# Patient Record
Sex: Female | Born: 1980 | Race: White | Hispanic: No | Marital: Married | Smoking: Never smoker
Health system: Southern US, Community
[De-identification: ages and names within clinical notes are randomized; demographics above are authoritative.]

## PROBLEM LIST (undated history)

## (undated) ENCOUNTER — Inpatient Hospital Stay (HOSPITAL_COMMUNITY): Payer: Self-pay

## (undated) DIAGNOSIS — F32A Depression, unspecified: Secondary | ICD-10-CM

## (undated) DIAGNOSIS — F329 Major depressive disorder, single episode, unspecified: Secondary | ICD-10-CM

## (undated) DIAGNOSIS — R1110 Vomiting, unspecified: Secondary | ICD-10-CM

## (undated) DIAGNOSIS — I10 Essential (primary) hypertension: Secondary | ICD-10-CM

## (undated) DIAGNOSIS — N1330 Unspecified hydronephrosis: Secondary | ICD-10-CM

## (undated) DIAGNOSIS — R87629 Unspecified abnormal cytological findings in specimens from vagina: Secondary | ICD-10-CM

## (undated) DIAGNOSIS — R51 Headache: Secondary | ICD-10-CM

## (undated) DIAGNOSIS — F419 Anxiety disorder, unspecified: Secondary | ICD-10-CM

## (undated) DIAGNOSIS — N611 Abscess of the breast and nipple: Secondary | ICD-10-CM

## (undated) DIAGNOSIS — N61 Mastitis without abscess: Secondary | ICD-10-CM

## (undated) DIAGNOSIS — T7840XA Allergy, unspecified, initial encounter: Secondary | ICD-10-CM

## (undated) HISTORY — DX: Depression, unspecified: F32.A

## (undated) HISTORY — DX: Major depressive disorder, single episode, unspecified: F32.9

## (undated) HISTORY — DX: Anxiety disorder, unspecified: F41.9

## (undated) HISTORY — DX: Abscess of the breast and nipple: N61.1

## (undated) HISTORY — PX: INCISE AND DRAIN ABCESS: PRO64

## (undated) HISTORY — DX: Unspecified abnormal cytological findings in specimens from vagina: R87.629

## (undated) HISTORY — DX: Allergy, unspecified, initial encounter: T78.40XA

## (undated) HISTORY — PX: MOUTH SURGERY: SHX715

## (undated) HISTORY — PX: INCISION AND DRAINAGE BREAST ABSCESS: SUR672

## (undated) HISTORY — DX: Unspecified hydronephrosis: N13.30

## (undated) HISTORY — DX: Mastitis without abscess: N61.0

---

## 2003-09-07 ENCOUNTER — Emergency Department (HOSPITAL_COMMUNITY): Admission: EM | Admit: 2003-09-07 | Discharge: 2003-09-07 | Payer: Self-pay | Admitting: Emergency Medicine

## 2004-09-22 ENCOUNTER — Emergency Department (HOSPITAL_COMMUNITY): Admission: EM | Admit: 2004-09-22 | Discharge: 2004-09-22 | Payer: Self-pay | Admitting: Emergency Medicine

## 2004-11-28 ENCOUNTER — Other Ambulatory Visit: Admission: RE | Admit: 2004-11-28 | Discharge: 2004-11-28 | Payer: Self-pay | Admitting: Obstetrics and Gynecology

## 2005-07-15 ENCOUNTER — Emergency Department (HOSPITAL_COMMUNITY): Admission: EM | Admit: 2005-07-15 | Discharge: 2005-07-16 | Payer: Self-pay | Admitting: Emergency Medicine

## 2009-12-21 ENCOUNTER — Ambulatory Visit: Payer: Self-pay | Admitting: Family Medicine

## 2010-01-20 ENCOUNTER — Emergency Department (HOSPITAL_COMMUNITY): Admission: EM | Admit: 2010-01-20 | Discharge: 2010-01-20 | Payer: Self-pay | Admitting: Emergency Medicine

## 2010-07-24 ENCOUNTER — Emergency Department (HOSPITAL_BASED_OUTPATIENT_CLINIC_OR_DEPARTMENT_OTHER): Admission: EM | Admit: 2010-07-24 | Discharge: 2010-07-24 | Payer: Self-pay | Admitting: Emergency Medicine

## 2010-10-16 NOTE — Letter (Signed)
Summary: Out of Work  [facility]  [address]   [place], [place] [address]   Phone: [phone]  Fax: [phone]    December 21, 2009   Employee:  AVAIAH STEMPEL    To Whom It May Concern:   For Medical reasons, please excuse the above named employee from work for the following dates:  Start:   21 Dec 2009  End:   23 Dec 2009 IF SYMPTOMS RESOLVING AND NO FEVER  If you need additional information, please feel free to contact our office.         Sincerely,    Marvis Moeller DO

## 2010-10-16 NOTE — Assessment & Plan Note (Signed)
Summary: ABD PAIN/VOMITING,DIAHREAH/DIZZINESS   Vital Signs:  Patient Profile:   30 Years Old Female CC:      chills, N/V/D, abd pain, X last night Height:     61 inches Weight:      108 pounds O2 Sat:      100 % O2 treatment:    Room Air Temp:     96.6 degrees F oral Pulse rate:   104 / minute Pulse rhythm:   regular Resp:     18 per minute BP sitting:   125 / 82  (right arm) Cuff size:   regular  Pt. in pain?   yes    Location:   abdomen    Intensity:   8    Type:       cramping  Vitals Entered By: Lajean Saver RN (December 21, 2009 8:56 AM)                   Updated Prior Medication List: MULTIVITAMINS  TABS (MULTIPLE VITAMIN) once daily MAGNESIUM GLUCONATE 500 MG TABS (MAGNESIUM GLUCONATE) once daily PEPTO-BISMOL MAX STRENGTH 525 MG/15ML SUSP (BISMUTH SUBSALICYLATE) as needed since last night  Current Allergies: ! PHENERGAN (PROMETHAZINE HCL)History of Present Illness Chief Complaint: chills, N/V/D, abd pain, X last night History of Present Illness: PATIENT STATES SHE ATE AT APPROX 9 PM. SUSHIE. VOMITING , CRAMPS AND DIARRHEA STARTED AT ABOUT 11 PM. HAS CONTINUED. NO FEVER. NO BLOOD IN STOOL OR EMISIS. HAS TAKEN PEPTO TABS. DENIES DIZZINESS.   REVIEW OF SYSTEMS Constitutional Symptoms       Complains of chills.     Denies fever, night sweats, weight loss, weight gain, and fatigue.  Eyes       Denies change in vision, eye pain, eye discharge, glasses, contact lenses, and eye surgery. Ear/Nose/Throat/Mouth       Denies hearing loss/aids, change in hearing, ear pain, ear discharge, dizziness, frequent runny nose, frequent nose bleeds, sinus problems, sore throat, hoarseness, and tooth pain or bleeding.  Respiratory       Denies dry cough, productive cough, wheezing, shortness of breath, asthma, bronchitis, and emphysema/COPD.  Cardiovascular       Denies murmurs, chest pain, and tires easily with exhertion.    Gastrointestinal       Complains of stomach pain,  nausea/vomiting, and diarrhea.      Denies constipation, blood in bowel movements, and indigestion. Genitourniary       Denies painful urination, kidney stones, and loss of urinary control. Neurological       Complains of weakness.      Denies headaches, loss of or changes in sensation, numbness, tngling, tremors, paralysis, seizures, and fainting/blackouts. Musculoskeletal       Denies muscle pain, joint pain, joint stiffness, decreased range of motion, redness, swelling, muscle weakness, and gout.  Skin       Denies bruising, unusual mles/lumps or sores, and hair/skin or nail changes.  Psych       Denies mood changes, temper/anger issues, anxiety/stress, speech problems, depression, and sleep problems. Other Comments: symptoms X last night   Past History:  Past Medical History: Unremarkable  Past Surgical History: Denies surgical history  Family History: Mother- Breast CA  Social History: Occupation: Herbalist Married Never Smoked Alcohol use-no Drug use-no Regular exercise-no Smoking Status:  never Drug Use:  no Does Patient Exercise:  no Physical Exam General appearance: well developed, well nourished, no acute distress Eyes: conjunctivae and lids normal Oral/Pharynx: tongue normal, posterior pharynx  without erythema or exudate Neck: neck supple,  trachea midline, no masses Chest/Lungs: no rales, wheezes, or rhonchi bilateral, breath sounds equal without effort Heart: regular rate and  rhythm, no murmur Abdomen: GENERALIZED TENDERNESS. BS HYPERACTIVE. NO MASS. NO GUARDING Skin: no obvious rashes or lesions ORTHO VS NEG Assessment New Problems: GASTROENTERITIS (ICD-558.9)   Plan New Medications/Changes: BENTYL 10 MG CAPS (DICYCLOMINE HCL) 1 by mouth Q 6 HRS as needed CRAMPS  ##20 x 0, 12/21/2009, Torian Thoennes DO ZOFRAN ODT 4 MG TBDP (ONDANSETRON) 1 by mouth Q 6-8 HRS as needed N/V  ##10 x 0, 12/21/2009, Marvis Moeller DO  New Orders: New Patient  Level III [99203] Admin of Therapeutic Inj  intramuscular or subcutaneous [96372]   Prescriptions: BENTYL 10 MG CAPS (DICYCLOMINE HCL) 1 by mouth Q 6 HRS as needed CRAMPS  ##20 x 0   Entered and Authorized by:   Marvis Moeller DO   Signed by:   Marvis Moeller DO on 12/21/2009   Method used:   Print then Give to Patient   RxID:   [id number] ZOFRAN ODT 4 MG TBDP (ONDANSETRON) 1 by mouth Q 6-8 HRS as needed N/V  ##10 x 0   Entered and Authorized by:   Marvis Moeller DO   Signed by:   Marvis Moeller DO on 12/21/2009   Method used:   Print then Give to Patient   RxID:   [id number]   Patient Instructions: 1)  SIPS OF CLEAR LIQUIDS FREQUENTLY. AVOID CAFFEINE AND MILK PRODUCTS. AD VANCE DIET WITH JELLO, CHICKEN NOODLE SOUP , CRACKERS AS DISCUSSED. ROPORT TO THE LOCAL ER IF SYMPTOMS PERSIST DESPITE TREATMENT, IF FEVER DEVELOPES , OR PAIN INCREASES.    Medication Administration  Injection # 1:    Medication: ondansetron    Diagnosis: GASTROENTERITIS (ICD-558.9)    Route: IM    Site: RUOQ gluteus    Exp Date: 05/17/2011    Lot #: [id number]    Mfr: Hospira    Patient tolerated injection without complications    Given by: Lajean Saver RN (December 21, 2009 10:41 AM)  Orders Added: 1)  New Patient Level III [99203] 2)  Admin of Therapeutic Inj  intramuscular or subcutaneous [[id number]]

## 2010-11-27 LAB — URINALYSIS, ROUTINE W REFLEX MICROSCOPIC
Ketones, ur: >80 mg/dL — AB
Nitrite: NEGATIVE
Specific Gravity, Urine: 1.016 (ref 1.005–1.030)
Urobilinogen, UA: 0.2 mg/dL (ref 0.0–1.0)
pH: 7 (ref 5.0–8.0)

## 2010-11-27 LAB — BASIC METABOLIC PANEL
BUN: 7 mg/dL (ref 6–23)
CO2: 20 mEq/L (ref 19–32)
Calcium: 10.2 mg/dL (ref 8.4–10.5)
GFR calc non Af Amer: >60 mL/min (ref >60)
Potassium: 3.7 mEq/L (ref 3.5–5.1)

## 2010-11-27 LAB — PREGNANCY, URINE: Preg Test, Ur: POSITIVE

## 2010-11-27 LAB — URINE MICROSCOPIC-ADD ON

## 2010-12-04 LAB — BASIC METABOLIC PANEL
Calcium: 9.6 mg/dL (ref 8.4–10.5)
Chloride: 105 mEq/L (ref 96–112)
GFR calc Af Amer: >60 mL/min (ref >60)
GFR calc non Af Amer: >60 mL/min (ref >60)
Potassium: 4 mEq/L (ref 3.5–5.1)
Sodium: 138 mEq/L (ref 135–145)

## 2010-12-04 LAB — URINALYSIS, ROUTINE W REFLEX MICROSCOPIC
Glucose, UA: NEGATIVE mg/dL
Ketones, ur: 40 mg/dL — AB
Nitrite: NEGATIVE
Protein, ur: NEGATIVE mg/dL
Specific Gravity, Urine: 1.019 (ref 1.005–1.030)
Urobilinogen, UA: 0.2 mg/dL (ref 0.0–1.0)
pH: 6.5 (ref 5.0–8.0)

## 2010-12-04 LAB — URINE MICROSCOPIC-ADD ON

## 2010-12-04 LAB — DIFFERENTIAL
Basophils Relative: 1 % (ref 0–1)
Eosinophils Absolute: 0.1 10*3/uL (ref 0.0–0.7)
Neutrophils Relative %: 63 % (ref 43–77)

## 2010-12-04 LAB — CBC
Platelets: 296 10*3/uL (ref 150–400)
RBC: 4.02 MIL/uL (ref 3.87–5.11)
WBC: 9.2 10*3/uL (ref 4.0–10.5)

## 2011-03-15 ENCOUNTER — Inpatient Hospital Stay (HOSPITAL_COMMUNITY)
Admission: AD | Admit: 2011-03-15 | Discharge: 2011-03-17 | DRG: 775 | Disposition: A | Discharge status: Home / Self Care | Payer: Medicaid Other | Source: Ambulatory Visit | Attending: Obstetrics and Gynecology | Admitting: Obstetrics and Gynecology

## 2011-03-15 DIAGNOSIS — O9903 Anemia complicating the puerperium: Secondary | ICD-10-CM | POA: Diagnosis not present

## 2011-03-15 DIAGNOSIS — O135 Gestational [pregnancy-induced] hypertension without significant proteinuria, complicating the puerperium: Principal | ICD-10-CM | POA: Diagnosis not present

## 2011-03-15 DIAGNOSIS — D649 Anemia, unspecified: Secondary | ICD-10-CM | POA: Diagnosis not present

## 2011-03-15 LAB — CBC
Hemoglobin: 12.6 g/dL (ref 12.0–15.0)
Platelets: 236 10*3/uL (ref 150–400)
RDW: 13 % (ref 11.5–15.5)

## 2011-03-15 LAB — COMPREHENSIVE METABOLIC PANEL
ALT: 8 U/L (ref 0–35)
AST: 15 U/L (ref 0–37)
BUN: 8 mg/dL (ref 6–23)
CO2: 22 mEq/L (ref 19–32)
Calcium: 10 mg/dL (ref 8.4–10.5)
Chloride: 95 mEq/L — ABNORMAL LOW (ref 96–112)
Creatinine, Ser: 0.59 mg/dL (ref 0.50–1.10)
Potassium: 3.6 mEq/L (ref 3.5–5.1)
Total Bilirubin: 0.4 mg/dL (ref 0.3–1.2)
Total Protein: 6.4 g/dL (ref 6.0–8.3)

## 2011-03-15 LAB — URINALYSIS, ROUTINE W REFLEX MICROSCOPIC
Ketones, ur: 40 mg/dL — AB
Nitrite: NEGATIVE
pH: 8 (ref 5.0–8.0)

## 2011-03-15 LAB — URIC ACID: Uric Acid, Serum: 4.8 mg/dL (ref 2.4–7.0)

## 2011-03-15 LAB — RPR: RPR Ser Ql: NONREACTIVE

## 2011-03-15 LAB — LACTATE DEHYDROGENASE: LDH: 147 U/L (ref 94–250)

## 2011-03-16 LAB — COMPREHENSIVE METABOLIC PANEL
ALT: 13 U/L (ref 0–35)
Albumin: 2.5 g/dL — ABNORMAL LOW (ref 3.5–5.2)
Alkaline Phosphatase: 134 U/L — ABNORMAL HIGH (ref 39–117)
BUN: 8 mg/dL (ref 6–23)
Chloride: 95 mEq/L — ABNORMAL LOW (ref 96–112)
Creatinine, Ser: 0.64 mg/dL (ref 0.50–1.10)
GFR calc Af Amer: >60 mL/min (ref >60)
GFR calc non Af Amer: >60 mL/min (ref >60)
Glucose, Bld: 92 mg/dL (ref 70–99)
Total Bilirubin: 0.4 mg/dL (ref 0.3–1.2)
Total Protein: 5.9 g/dL — ABNORMAL LOW (ref 6.0–8.3)

## 2011-03-16 LAB — CBC
MCH: 31.7 pg (ref 26.0–34.0)
Platelets: 186 10*3/uL (ref 150–400)
RBC: 3.19 MIL/uL — ABNORMAL LOW (ref 3.87–5.11)

## 2011-03-16 LAB — LACTATE DEHYDROGENASE: LDH: 237 U/L (ref 94–250)

## 2011-03-16 LAB — URIC ACID: Uric Acid, Serum: 5.1 mg/dL (ref 2.4–7.0)

## 2011-03-17 LAB — URINALYSIS, MICROSCOPIC ONLY
Bilirubin Urine: NEGATIVE
Hgb urine dipstick: NEGATIVE
Protein, ur: NEGATIVE mg/dL
pH: 6 (ref 5.0–8.0)

## 2011-04-01 ENCOUNTER — Inpatient Hospital Stay (HOSPITAL_COMMUNITY)
Admission: AD | Admit: 2011-04-01 | Discharge: 2011-04-11 | DRG: 776 | Disposition: A | Discharge status: Home / Self Care | Payer: Medicaid Other | Source: Ambulatory Visit | Attending: Obstetrics and Gynecology | Admitting: Obstetrics and Gynecology

## 2011-04-01 DIAGNOSIS — O9122 Nonpurulent mastitis associated with the puerperium: Secondary | ICD-10-CM | POA: Diagnosis present

## 2011-04-01 DIAGNOSIS — N61 Mastitis without abscess: Secondary | ICD-10-CM

## 2011-04-01 DIAGNOSIS — N611 Abscess of the breast and nipple: Secondary | ICD-10-CM | POA: Clinically undetermined

## 2011-04-01 DIAGNOSIS — O9112 Abscess of breast associated with the puerperium: Principal | ICD-10-CM | POA: Diagnosis present

## 2011-04-01 DIAGNOSIS — I10 Essential (primary) hypertension: Secondary | ICD-10-CM | POA: Diagnosis present

## 2011-04-01 HISTORY — DX: Essential (primary) hypertension: I10

## 2011-04-01 MED ORDER — IRON 325 (65 FE) MG PO TABS: ORAL_TABLET | ORAL | Status: DC

## 2011-04-01 MED ORDER — IBUPROFEN 800 MG PO TABS
800.0000 mg | ORAL_TABLET | Freq: Three times a day (TID) | ORAL | Status: DC | PRN
  Administered 2011-04-03 – 2011-04-11 (×10): 800 mg via ORAL
  Filled 2011-04-01 (×11): qty 1

## 2011-04-01 MED ORDER — FERROUS SULFATE 325 (65 FE) MG PO TABS
325.0000 mg | ORAL_TABLET | Freq: Every day | ORAL | Status: DC
  Administered 2011-04-03 – 2011-04-11 (×7): 325 mg via ORAL
  Filled 2011-04-01 (×9): qty 1

## 2011-04-01 MED ORDER — CEFAZOLIN SODIUM-DEXTROSE 2-3 GM-% IV SOLR
2.0000 g | Freq: Four times a day (QID) | INTRAVENOUS | Status: DC
  Administered 2011-04-01 – 2011-04-05 (×16): 2 g via INTRAVENOUS
  Filled 2011-04-01 (×18): qty 50

## 2011-04-01 MED ORDER — ZOLPIDEM TARTRATE 5 MG PO TABS
5.0000 mg | ORAL_TABLET | Freq: Every evening | ORAL | Status: DC | PRN
  Administered 2011-04-01 – 2011-04-11 (×9): 5 mg via ORAL
  Filled 2011-04-01 (×9): qty 1

## 2011-04-01 MED ORDER — LACTATED RINGERS IV SOLN
INTRAVENOUS | Status: DC
  Administered 2011-04-01 – 2011-04-10 (×20): via INTRAVENOUS

## 2011-04-01 MED ORDER — OXYCODONE-ACETAMINOPHEN 5-325 MG PO TABS
1.0000 | ORAL_TABLET | ORAL | Status: DC | PRN
  Administered 2011-04-01: 2 via ORAL
  Administered 2011-04-02: 1 via ORAL
  Administered 2011-04-02 – 2011-04-05 (×11): 2 via ORAL
  Administered 2011-04-05 – 2011-04-06 (×3): 1 via ORAL
  Administered 2011-04-06: 2 via ORAL
  Administered 2011-04-06: 1 via ORAL
  Administered 2011-04-06: 2 via ORAL
  Administered 2011-04-06: 1 via ORAL
  Administered 2011-04-06 – 2011-04-11 (×16): 2 via ORAL
  Filled 2011-04-01 (×2): qty 2
  Filled 2011-04-01: qty 1
  Filled 2011-04-01: qty 2
  Filled 2011-04-01 (×2): qty 1
  Filled 2011-04-01 (×5): qty 2
  Filled 2011-04-01: qty 1
  Filled 2011-04-01 (×4): qty 2
  Filled 2011-04-01: qty 1
  Filled 2011-04-01 (×3): qty 2
  Filled 2011-04-01: qty 1
  Filled 2011-04-01: qty 2
  Filled 2011-04-01: qty 1
  Filled 2011-04-01 (×5): qty 2
  Filled 2011-04-01: qty 1
  Filled 2011-04-01 (×8): qty 2
  Filled 2011-04-01: qty 1

## 2011-04-01 MED ORDER — ONDANSETRON HCL 4 MG/2ML IJ SOLN
4.0000 mg | Freq: Three times a day (TID) | INTRAMUSCULAR | Status: DC | PRN
  Administered 2011-04-02 (×2): 4 mg via INTRAVENOUS
  Filled 2011-04-01 (×2): qty 2

## 2011-04-01 MED ORDER — SERTRALINE HCL 50 MG PO TABS
50.0000 mg | ORAL_TABLET | Freq: Every day | ORAL | Status: DC
  Administered 2011-04-01 – 2011-04-11 (×11): 50 mg via ORAL
  Filled 2011-04-01 (×11): qty 1

## 2011-04-01 NOTE — H&P (Signed)
  S: 30 y/o G1P1 s/p SVD Diagnosed with Mastitis on Friday and started on Dicloxicillin 500 mg po States Left Breast significantly worse today, as well as Uterine cramping and pain. States passed quarter size clot this am.   Review of Systems  Constitutional: Positive for chills and malaise/fatigue. Negative for fever and weight loss.  HENT: Negative.   Eyes: Negative.   Respiratory: Negative.   Cardiovascular: Negative.   Gastrointestinal: Positive for nausea and abdominal pain. Negative for heartburn, vomiting, diarrhea, constipation, blood in stool and melena.  Genitourinary: Negative.   Musculoskeletal: Negative.   Skin: Negative.   Neurological: Positive for weakness.  Psychiatric/Behavioral: Negative.     O; VS 97.3, 101, 20, 144/92 Physical Exam  Constitutional: She is oriented to person, place, and time. She appears well-developed and well-nourished.  HENT:  Head: Normocephalic.  Neck: Normal range of motion.  Cardiovascular: Normal rate, regular rhythm and normal heart sounds.   Pulmonary/Chest: Breath sounds normal. No respiratory distress. Right breast exhibits nipple discharge (lactating), skin change and tenderness. Left breast exhibits nipple discharge. There is breast swelling.    Abdominal: Soft. Bowel sounds are normal. She exhibits no distension and no mass. There is tenderness (Slightly tender to fundal massage). There is no rebound and no guarding.  Musculoskeletal: Normal range of motion. She exhibits no edema and no tenderness.  Neurological: She is alert and oriented to person, place, and time.  Skin: Skin is warm and dry. There is pallor.  Psychiatric: She has a normal mood and affect. Her behavior is normal. Judgment and thought content normal.   A: Mastitis P: Admit for Abx Therapy and Breast support 1. Ancef 2 gms IVPB q 6 hrs 2. Percocet for pain prn 3. Continue to utilized Breast pump 4. Lactation consult 5. Dr. Estanislado Pandy aware and to follow

## 2011-04-02 MED ORDER — NIFEDIPINE ER 30 MG PO TB24
30.0000 mg | ORAL_TABLET | Freq: Every day | ORAL | Status: DC
  Administered 2011-04-02 – 2011-04-03 (×2): 30 mg via ORAL
  Filled 2011-04-02 (×3): qty 1

## 2011-04-02 MED ORDER — DEXTROSE 5 % IV SOLN
8.0000 mg | Freq: Three times a day (TID) | INTRAVENOUS | Status: DC | PRN
  Administered 2011-04-02 – 2011-04-04 (×4): 8 mg via INTRAVENOUS
  Filled 2011-04-02 (×3): qty 4

## 2011-04-02 MED ORDER — ONDANSETRON HCL 4 MG/2ML IJ SOLN: 4.0000 mg | INTRAMUSCULAR | Status: AC

## 2011-04-02 NOTE — Progress Notes (Signed)
UR Chart review completed.  

## 2011-04-02 NOTE — Progress Notes (Signed)
Subjective: Patient reports nausea this am, but tolerated po intake without vomiting.  Reports right breast less painful, area of nipple trauma less sensitive.  No chills, mild headache.  Denies pp depression.  Up to BR without syncope or dizziness.  Mild pain in left fundal area, mild cramping.  Objective: I have reviewed patient's vital signs and medications. BP 145/88  Pulse 102  Temp(Src) 98 F (36.7 C) (Oral)  Resp 18  Ht 5\' 1"  (1.549 m)  SpO2 98% General: alert Resp: clear to auscultation bilaterally Cardio: regular rate and rhythm, S1, S2 normal, no murmur, click, rub or gallop GI: soft, non-tender; bowel sounds normal; no masses,  no organomegaly Extremities: extremities normal, atraumatic, no cyanosis or edema Vaginal Bleeding: minimal Fundus:  2 below, no signifcant tenderness to palpation, no clots expressed. Breasts:  Left WNL                 Right still slightly red, area of nipple trauma healing well.   Assessment/Plan: Continue current care. Await breastmilk culture results   LOS: 1 day    Noelle Sease L 04/02/2011, 10:40 AM

## 2011-04-03 ENCOUNTER — Encounter (HOSPITAL_COMMUNITY): Payer: Self-pay

## 2011-04-03 MED ORDER — SULFAMETHOXAZOLE-TMP DS 800-160 MG PO TABS
1.0000 | ORAL_TABLET | Freq: Two times a day (BID) | ORAL | Status: DC
  Administered 2011-04-03 – 2011-04-05 (×5): 1 via ORAL
  Filled 2011-04-03 (×7): qty 1

## 2011-04-03 NOTE — Progress Notes (Signed)
Subjective: Patient reports nausea, tolerating PO and no problems voiding.  Taking both Percocet for pain with good results and Zofran for Nausea States  R Breast still significantly sore, continues to pump Infant has not been to hospital since patient's arrival, but states spouse "may bring her today" Denies increase in PP depression symptoms  Denies HA, Scotoma or Epigastric pain  Objective: I have reviewed patient's vital signs, intake and output, medications and still pending Breast Culture.  T: 98, P: 102, R: 18, BP: 143/98  General: alert, cooperative and drowsy Resp: clear to auscultation bilaterally Cardio: regular rate and rhythm, S1, S2 normal, no murmur, click, rub or gallop GI: soft, non-tender; bowel sounds normal; no masses,  no organomegaly Extremities: extremities normal, atraumatic, no cyanosis or edema, Homans sign is negative, no sign of DVT and no edema, redness or tenderness in the calves or thighs Vaginal Bleeding: minimal and without clots or odor Breast: still with errythema and tenderness, significantly less nipple trauma Fundus well below umbilicus and firm with no pain upon palpation Mild LUQ pain w/palp  Assessment/Plan: Acute PP Mastitis  Continue Antibiotic therapy pending Breast milk culture Continue Breast supportive care (awaiting Lactation consultion) MD to follow   LOS: 2 days    Anabel Halon 04/03/2011, 8:49 AM

## 2011-04-03 NOTE — Progress Notes (Signed)
Agree with CNM note.  Although erythema has decreased some since admission.  Patient reports pain is still excrutiating once pain medicine starts wearing off.  Lab reports staph aureus growing in culture but sensitivities will not be back until tomorrow.  With the tenderness remaining and the report of staph, will start bactrim ds.  Pt informed safety conditional with breast feeding.  Continue BP med.

## 2011-04-04 LAB — COMPREHENSIVE METABOLIC PANEL
Alkaline Phosphatase: 137 U/L — ABNORMAL HIGH (ref 39–117)
BUN: 3 mg/dL — ABNORMAL LOW (ref 6–23)
Chloride: 97 mEq/L (ref 96–112)
Creatinine, Ser: <0.47 mg/dL — ABNORMAL LOW (ref 0.50–1.10)
Glucose, Bld: 142 mg/dL — ABNORMAL HIGH (ref 70–99)
Potassium: 2.9 mEq/L — ABNORMAL LOW (ref 3.5–5.1)
Total Bilirubin: 0.1 mg/dL — ABNORMAL LOW (ref 0.3–1.2)
Total Protein: 7.4 g/dL (ref 6.0–8.3)

## 2011-04-04 LAB — DIFFERENTIAL
Eosinophils Absolute: 0.1 10*3/uL (ref 0.0–0.7)
Lymphs Abs: 2.7 10*3/uL (ref 0.7–4.0)
Monocytes Absolute: 0.9 10*3/uL (ref 0.1–1.0)
Monocytes Relative: 4 % (ref 3–12)
Neutrophils Relative %: 84 % — ABNORMAL HIGH (ref 43–77)

## 2011-04-04 LAB — CBC
HCT: 30.5 % — ABNORMAL LOW (ref 36.0–46.0)
Hemoglobin: 10.2 g/dL — ABNORMAL LOW (ref 12.0–15.0)
MCH: 30.2 pg (ref 26.0–34.0)
RBC: 3.38 MIL/uL — ABNORMAL LOW (ref 3.87–5.11)

## 2011-04-04 MED ORDER — NIFEDIPINE ER 60 MG PO TB24
60.0000 mg | ORAL_TABLET | Freq: Every day | ORAL | Status: DC
  Administered 2011-04-04 – 2011-04-08 (×5): 60 mg via ORAL
  Filled 2011-04-04 (×6): qty 1

## 2011-04-04 NOTE — Progress Notes (Signed)
Subjective: Patient reports right breast is still painful, but slightly less than yesterday.  Nipple feels less painful and raw.  Denies HA, visual symptoms, or epigastric pain.  Reports occasional teary spells when pumping, but denies significant postpartum depression.  Pumping and discarding breastmilk at present, due to staph aureas breastmilk culture.  Denies chills, fever, or abdominal/uterine pain.  Objective: I have reviewed patient's vital signs and medications. BP was elevated this am, with Dr. Su Hilt notified.  Procardia dose increased to 60 mg XL this am. Now on Bactrim po.  BP 1 hour after Procardia 144/88.  On contact precautions for staph aureus breast milk culture.  Filed Vitals:   04/03/11 2256 04/04/11 0158 04/04/11 0610 04/04/11 0630  BP: 145/95  156/108 158/106  Pulse: 87  98   Temp: 99.3 F (37.4 C) 98.3 F (36.8 C) 99.1 F (37.3 C)   TempSrc: Oral Oral Oral   Resp: 18  19   Height:      Weight:      SpO2: 98%  96%      General: alert, in good spirits Resp: clear to auscultation bilaterally Cardio: regular rate and rhythm, S1, S2 normal, no murmur, click, rub or gallop GI: soft, non-tender; bowel sounds normal; no masses,  no organomegaly Extremities: extremities normal, atraumatic, no cyanosis or edema, DTR 2+ without clonus, trace edema. Vaginal Bleeding: minimal Uterus:  Non-tender, fundus firm, well-involuted. Breasts:  Left WNL                 Right:  Approximately 5-6 cm area of redness and induration in the lower half of the breast, under the nipple and aerola.                 Right nipple healing well from previous trauma. Breast milk culture showed staph aureus--awaiting sensitivities.  Assessment/Plan: Continue antibiotics. Continue Bactrim at present--await sensitivities. Continue to monitor BP s/p increasing Procardia dose to 60 mg XL qday. MDs will follow.  LOS: 3 days   Shamyah Stantz L 04/04/2011

## 2011-04-04 NOTE — Progress Notes (Signed)
Breast assessed by Lactation.  LLQ & RLQ of Right breast reddened, warm & tender to touch.  Quarter sized, hard knot noted on LLQ of right breast - very tender to touch.  Mom on contact precautions pending MRSA cultures & on IV Antibiotics (Bactrim - L3 classification for BF & Ancef - L1 classification for BF Bobbye Morton - Medications & Mother's Milk)).  Pumping with DEBP q3hrs, mom states obtaining 4 ounces total (2 ounces from each breast) with pumping.  Ice pack given and instructed to ice Right breast 15-20 minutes prior to pumping.  Instructed to gently massage affected areas while pumping to prevent continuing milk stasis.  Mom pumping and dumping at present until MRSA cultures completed and IV Bactrim completed or changed to an Abx compatible with breastfeeding.  Encouraged to continue pumping q3hrs, 8x/24hrs.  Mom verbalized understanding.  Plan of care communicated to RN.

## 2011-04-04 NOTE — Progress Notes (Signed)
Dr. Su Hilt called. New order to increase Procardia XL to 60mg  qd.

## 2011-04-04 NOTE — Progress Notes (Signed)
Gevena Barre notified for pts. BP 158/106. No new orders. Told to call Dr.Roberts. Will page Dr.Roberts now.

## 2011-04-05 ENCOUNTER — Inpatient Hospital Stay
Admit: 2011-04-05 | Discharge: 2011-04-05 | Disposition: A | Discharge status: Home / Self Care | Payer: Medicaid Other | Attending: General Surgery | Admitting: General Surgery

## 2011-04-05 ENCOUNTER — Inpatient Hospital Stay: Admit: 2011-04-05 | Discharge: 2011-04-05 | Disposition: A | Discharge status: Home / Self Care | Payer: Medicaid Other

## 2011-04-05 ENCOUNTER — Inpatient Hospital Stay: Admit: 2011-04-05 | Payer: Medicaid Other

## 2011-04-05 DIAGNOSIS — N61 Mastitis without abscess: Secondary | ICD-10-CM

## 2011-04-05 LAB — DIFFERENTIAL
Basophils Absolute: 0 10*3/uL (ref 0.0–0.1)
Lymphocytes Relative: 9 % — ABNORMAL LOW (ref 12–46)
Neutro Abs: 18.2 10*3/uL — ABNORMAL HIGH (ref 1.7–7.7)
Neutrophils Relative %: 87 % — ABNORMAL HIGH (ref 43–77)

## 2011-04-05 LAB — CBC
Platelets: 627 10*3/uL — ABNORMAL HIGH (ref 150–400)
RDW: 13.2 % (ref 11.5–15.5)
WBC: 21 10*3/uL — ABNORMAL HIGH (ref 4.0–10.5)

## 2011-04-05 MED ORDER — VANCOMYCIN HCL 1000 MG IV SOLR
1500.0000 mg | Freq: Two times a day (BID) | INTRAVENOUS | Status: DC
  Administered 2011-04-05 – 2011-04-10 (×10): 1500 mg via INTRAVENOUS
  Filled 2011-04-05 (×12): qty 1500

## 2011-04-05 MED ORDER — DIPHENHYDRAMINE HCL 50 MG/ML IJ SOLN
25.0000 mg | Freq: Four times a day (QID) | INTRAMUSCULAR | Status: DC | PRN
  Administered 2011-04-05 – 2011-04-10 (×9): 25 mg via INTRAVENOUS
  Filled 2011-04-05 (×9): qty 1

## 2011-04-05 NOTE — Progress Notes (Signed)
Brittany Beltran is a 30 y.o. Admitted with mastitis.  Reports that she is feeling worse today.  Her breast pain is progressing.   Subjective:   Objective: BP 128/86  Pulse 77  Temp(Src) 98.7 F (37.1 C) (Oral)  Resp 19  Ht 5\' 1"  (1.549 m)  Wt 76.204 kg (168 lb)  BMI 31.74 kg/m2  SpO2 97%    Breast:  Area is tenderness, and redness is getting larger.       Labs: Lab Results  Component Value Date   WBC 22.9* 04/04/2011   HGB 10.2* 04/04/2011   HCT 30.5* 04/04/2011   MCV 90.2 04/04/2011   PLT 578* 04/04/2011    Assessment / Plan: Progressing Mastitis  Will consult  Gen Surgery.  Continue Ancef and Septra for now. CBC.  Eliette Drumwright V 04/05/2011, 10:26 AM

## 2011-04-05 NOTE — Progress Notes (Signed)
UR Chart review completed.  

## 2011-04-05 NOTE — Consult Note (Signed)
ANTIBIOTIC CONSULT NOTE - INITIAL  Pharmacy Consult for vancomycin Indication: mastitis  Patient Measurements: Height: 5\' 1"  (154.9 cm) Weight: 168 lb (76.204 kg) IBW/kg (Calculated) : 47.8    Vital Signs: Temp: 97.4 F (36.3 C) (07/20 1400) Temp src: Oral (07/20 1400) BP: 125/78 mmHg (07/20 1400) Pulse Rate: 113  (07/20 1400)   Labs:  Basename 04/04/11 1202  WBC 22.9*  HGB 10.2*  PLT 578*  LABCREA --  CREATININE <0.47*  CRCLEARANCE --   Microbiology: No results found for this or any previous visit (from the past 720 hour(s)).  Medical History: Past Medical History  Diagnosis Date  . Hypertension     Medications:  Anti-infectives     Start     Dose/Rate Route Frequency Ordered Stop   04/03/11 1100   sulfamethoxazole-trimethoprim (BACTRIM DS) 800-160 MG per tablet 1 tablet  Status:  Discontinued        1 tablet Oral Every 12 hours 04/03/11 1043 04/05/11 1451   04/01/11 1800   ceFAZolin (ANCEF) IVPB 2 g/50 mL premix  Status:  Discontinued        2 g 100 mL/hr over 30 Minutes Intravenous 4 times per day 04/01/11 1724 04/05/11 1451        Pt on dicloxacillin as outpatient.   Assessment: 30 yo with normal creatinine clearance.  Has failed therapy on Bactrim and Ancef.  Elevated WBC and febrile.   Goal of Therapy:  Vancomycin trough level 10-15 mcg/ml  Plan:  Give vancomycin 1500mg  Q12H to start today.  Measure antibiotic drug levels at steady state  Wilson, Manaal [place] 04/05/2011,3:11 PM

## 2011-04-06 NOTE — Progress Notes (Signed)
Subjective: Patient reports tolerating PO.  Feels better. Redness and swelling are better after benadryl (patient developed "red-man" syndrome after Vancomycin).  Objective: I have reviewed patient's vital signs and intake and output.  General: alert and no distress Right breast is still tender and red but less so than yesterday.  Swelling is better.   Assessment/Plan: Slow improvement of right mastitis.  Better after Vanc and drainage.  LOS: 5 days    Brittany Beltran V 04/06/2011, 12:30 PM

## 2011-04-06 NOTE — Progress Notes (Signed)
  Subjective: Pain is better but not resolved after aspiration yesterday.  Patient is currently pumping her breasts.  Cultures are pending.    Objective: Vital signs in last 24 hours: Temp:  [97.4 F (36.3 C)-98.3 F (36.8 C)] 97.8 F (36.6 C) (07/21 0500) Pulse Rate:  [87-113] 87  (07/21 0500) Resp:  [18] 18  (07/21 0500) BP: (122-135)/(78-89) 123/89 mmHg (07/21 0500) SpO2:  [97 %-99 %] 98 % (07/21 0500) Last BM Date: 04/03/11  Intake/Output from previous day: 07/20 0701 - 07/21 0700 In: 2662.1 [P.O.:1160; I.V.:1502.1] Out: 1475 [Urine:1475] Intake/Output this shift:      Lab Results:  @LABLAST2 (wbc:2,hgb:2,hct:2,plt:2) BMET  Basename 04/04/11 1202  NA 136  K 2.9*  CL 97  CO2 27  GLUCOSE 142*  BUN 3*  CREATININE <0.47*  CALCIUM 9.3   PT/INR No results found for this basename: LABPROT:2,INR:2 in the last 72 hours ABG No results found for this basename: PHART:2,PCO2:2,PO2:2,HCO3:2 in the last 72 hours  Studies/Results: No results found.  Anti-infectives: Anti-infectives     Start     Dose/Rate Route Frequency Ordered Stop   04/03/11 1100   sulfamethoxazole-trimethoprim (BACTRIM DS) 800-160 MG per tablet 1 tablet  Status:  Discontinued        1 tablet Oral Every 12 hours 04/03/11 1043 04/05/11 1451   04/01/11 1800   ceFAZolin (ANCEF) IVPB 2 g/50 mL premix  Status:  Discontinued        2 g 100 mL/hr over 30 Minutes Intravenous 4 times per day 04/01/11 1724 04/05/11 1451          Assessment/Plan: s/p  Patient is at risk for lactiferous fistula should open drainage be required.  Await cultures and adjust antibiotics if necessary.  CCS will follow with you.   LOS: 5 days    Illya Gienger B 04/06/2011

## 2011-04-06 NOTE — Consult Note (Signed)
Brittany Beltran, Brittany Beltran             ACCOUNT NO.:  [id number]  MEDICAL RECORD NO.:  [id number]  LOCATION:  9306                          FACILITY:  [facility]  PHYSICIAN:  Sharlet Salina T. Hoxworth, M.D.DATE OF BIRTH:  Jan 21, 1981  DATE OF CONSULTATION:  04/05/2011 DATE OF DISCHARGE:                                CONSULTATION   CONSULTATION TIME:  12:20 p.m.  REQUESTING PHYSICIAN:  Janine Limbo, MD  CONSULTING SURGEON:  Lorne Skeens. Hoxworth, MD  REASON FOR CONSULTATION:  Right breast mastitis.  HISTORY OF PRESENT ILLNESS:  Brittany Beltran is a 30 year old white female with no other significant medical problems who is approximately 3 weeks postpartum.  Last Friday 1 week ago, her baby latched onto her right breast but jerked off.  In doing so this ripped her nipple.  She says that it appear that she has some purulent drainage from this.  She went to her OB's office where she was diagnosed with mastitis.  She was placed on some antibiotics, however, Monday, she called them back as this was worsening.  After being evaluated by her obstetrician, she was admitted for further management.  She has been placed on IV Ancef as well as p.o. Septra while in the hospital.  However, today she had some lab work which revealed a white count of 22,000.  Apparently, her erythema has significantly worsened in the last day, at this time, we have been asked to evaluate the patient.  She did have a culture of her breast milk done in the doctor's office which revealed staph.  The patient does admit to some fevers as well as chills.  REVIEW OF SYSTEMS:  Please see HPI.  Otherwise all other systems have been reviewed and are currently negative.  FAMILY HISTORY:  Noncontributory.  PAST MEDICAL HISTORY:  Hypertension, postpartum depression.  PAST SURGICAL HISTORY:  None.  SOCIAL HISTORY:  The patient is married with one child.  She denies any alcohol, tobacco, or illicit drug abuse.  ALLERGIES:   PHENERGAN.  MEDICATIONS: 1. Zoloft 50 mg daily. 2. Zantac 150 mg b.i.d. 3. Probiotic. 4. Percocet 5/325 one tablet q.4 hours p.r.n. pain. 5. Zofran 8 mg q.8 hours as needed. 6. Multivitamin. 7. Iron. 8. Advil as needed. 9. Diflucan 150 mg once. 10.Dynapen 250 mg 2 tablets q.4 hours.  PHYSICAL EXAMINATION:  GENERAL:  Brittany Beltran is a pleasant well- developed, well-nourished 30 year old white female in no acute distress. VITAL SIGNS:  Temperature 100, pulse 114, blood pressure 120/71, respirations 18. HEENT:  Head is normocephalic, atraumatic.  Sclerae are noninjected. Pupils are equal, round, and reactive to light.  Ears and nose without any obvious masses or lesions.  No rhinorrhea.  Mouth is pink.  Throat shows no exudate. HEART:  Regular rhythm, however, she is tachycardic.  Otherwise normal S1 and S2.  No murmurs, gallops, or rubs are noted.  She does have palpable carotid, radial, and pedal pulses bilaterally. LUNGS:  Clear to auscultation bilaterally with no wheezes, rhonchi, or rales are noted.  Respiratory is nonlabored. CHEST/BREASTS:  The patient's right breast in the inferior portion on the right and left side is diffuse erythema.  There is no definite fluctuance noted or palpable abscess.  There is some induration noted. There is no spontaneous drainage or open wound noted.  No nipple discharge except for secondary to lactation.  The left breast is normal with no masses or nipple discharge noted. ABDOMEN:  Soft, nontender, nondistended with active bowel sounds.  No masses, hernias, or organomegaly are noted. PSYCHIATRIC:  The patient is alert and oriented x3 with an appropriate affect.  LABORATORY DATA AND DIAGNOSTICS:  White blood cell count 22,900, hemoglobin 10.2, hematocrit 30.5, platelet count is 578,000. Diagnostics:  Ultrasound of the breasts will be ordered today.  IMPRESSION: 1. Right breast mastitis. 2. Three weeks postpartum.  PLAN:  I have  discussed this patient with Dr. Johna Sheriff as well as Dr. Stefano Gaul.  It does not appear that the patient's Ancef or Septra are making a significant difference.  Given the fact that her cultures were Staphylococcus and the possibility of MRSA, we will change her to vancomycin which is safe in breast feeding.  If this does not begin to help then we may need to broaden the coverage and add Zosyn which is also safe in breast feeding.  In the meantime, we will obtain a right breast ultrasound to evaluate for an abscess.  If this is positive, then we will have the radiologist go ahead and aspirate this [facility].  We do not want to have to do a incision and drainage if we do not have to as in any lactating female, this will obviously affect terminal production, inability to breast feed.  If for some reason, there was no evidence of breast abscess, then at this point,we would recommend continuing IV antibiotic therapy.     Brittany Cape, PA   ______________________________ Lorne Skeens. Hoxworth, M.D.    KEO/MEDQ  D:  04/05/2011  T:  04/06/2011  Job:  [id number]  cc:   Janine Limbo, M.D. Fax: [phone]  CCS

## 2011-04-06 NOTE — Consult Note (Signed)
ANTIBIOTIC CONSULT NOTE - FOLLOW UP  Pharmacy Consult for Vancomycin Indication: mastitis   Patient Measurements: Height: 5\' 1"  (154.9 cm) Weight: 168 lb (76.204 kg) IBW/kg (Calculated) : 47.8  Adjusted Body Weight: 76 kg  Vital Signs: Temp: 98.1 F (36.7 C) (07/21 1400) Temp src: Oral (07/21 1400) BP: 113/67 mmHg (07/21 1400) Pulse Rate: 108  (07/21 1400) Intake/Output from previous day: 07/20 0701 - 07/21 0700 In: 2662.1 [P.O.:1160; I.V.:1502.1] Out: 1475 [Urine:1475] Intake/Output from this shift: I/O this shift: In: 960 [P.O.:960] Out: 1350 [Urine:1350]  Labs:  [facility] 04/05/11 1815 04/04/11 1202  WBC 21.0* 22.9*  HGB 10.2* 10.2*  PLT 627* 578*  LABCREA -- --  CREATININE -- <0.47*  CRCLEARANCE -- --    Basename 04/06/11 1523  VANCOTROUGH 13.0  VANCOPEAK --  VANCORANDOM --  GENTTROUGH --  GENTPEAK --  GENTRANDOM --  TOBRATROUGH --  TOBRAPEAK --  TOBRARND --  AMIKACINPEAK --  AMIKACINTROU --  AMIKACIN --     Microbiology: No results found for this or any previous visit (from the past 720 hour(s)).  Anti-infectives     Start     Dose/Rate Route Frequency Ordered Stop   04/03/11 1100   sulfamethoxazole-trimethoprim (BACTRIM DS) 800-160 MG per tablet 1 tablet  Status:  Discontinued        1 tablet Oral Every 12 hours 04/03/11 1043 04/05/11 1451   04/01/11 1800   ceFAZolin (ANCEF) IVPB 2 g/50 mL premix  Status:  Discontinued        2 g 100 mL/hr over 30 Minutes Intravenous 4 times per day 04/01/11 1724 04/05/11 1451          Assessment:Vancomycin trough = 13. Goal trough is 10-15. Trough is in desired range.  Goal of Therapy:  Vancomycin trough level 10-15 mcg/ml    Plan:  Follow up culture results  Trough is in desired range. Continue same dose of Vancomycin (1500 mg q12 hrs).  Eugene Garnet 04/06/2011,4:44 PM

## 2011-04-07 LAB — CBC
HCT: 27.9 % — ABNORMAL LOW (ref 36.0–46.0)
Hemoglobin: 9.2 g/dL — ABNORMAL LOW (ref 12.0–15.0)
MCH: 30 pg (ref 26.0–34.0)
MCHC: 33 g/dL (ref 30.0–36.0)
MCV: 90.9 fL (ref 78.0–100.0)
Platelets: 602 K/uL — ABNORMAL HIGH (ref 150–400)
RBC: 3.07 MIL/uL — ABNORMAL LOW (ref 3.87–5.11)
RDW: 13.6 % (ref 11.5–15.5)
WBC: 14.1 K/uL — ABNORMAL HIGH (ref 4.0–10.5)

## 2011-04-07 NOTE — Progress Notes (Addendum)
Subjective: Patient reports that she is feeling much better.  Objective: I have reviewed patient's vital signs.  General: no distress Breast is softer and less tender.  redness is less.  CBC    Component Value Date/Time   WBC 14.1* 04/07/2011 0535   RBC 3.07* 04/07/2011 0535   HGB 9.2* 04/07/2011 0535   HCT 27.9* 04/07/2011 0535   PLT 602* 04/07/2011 0535   MCV 90.9 04/07/2011 0535   MCH 30.0 04/07/2011 0535   MCHC 33.0 04/07/2011 0535   RDW 13.6 04/07/2011 0535   NEUTROABS 18.2* 04/05/2011 1815   LYMPHSABS 2.0 04/05/2011 1815   MONOABS 0.7 04/05/2011 1815   EOSABS 0.1 04/05/2011 1815   BASOSABS 0.0 04/05/2011 1815    Assessment/Plan: Improved mastitis. OK to discharge when approved by Gen Surg.  LOS: 6 days    Brittany Beltran V 04/07/2011, 8:59 AM

## 2011-04-07 NOTE — Progress Notes (Signed)
  Subjective: Feeling much better today.  Back pumping...wants to continue breast feeding if possible  Objective: Vital signs in last 24 hours: Temp:  [97.8 F (36.6 C)-98.4 F (36.9 C)] 97.8 F (36.6 C) (07/22 1016) Pulse Rate:  [91-118] 91  (07/22 1016) Resp:  [16-20] 20  (07/22 1016) BP: (113-133)/(67-94) 132/94 mmHg (07/22 1016) SpO2:  [96 %-98 %] 98 % (07/22 1016) Last BM Date: 04/06/11  Intake/Output from previous day: 07/21 0701 - 07/22 0700 In: 2040 [P.O.:2040] Out: 5550 [Urine:5550] Intake/Output this shift:    Lab Results:  @LABLAST2 (wbc:2,hgb:2,hct:2,plt:2) BMET  Basename 04/04/11 1202  NA 136  K 2.9*  CL 97  CO2 27  GLUCOSE 142*  BUN 3*  CREATININE <0.47*  CALCIUM 9.3   PT/INR No results found for this basename: LABPROT:2,INR:2 in the last 72 hours ABG No results found for this basename: PHART:2,PCO2:2,PO2:2,HCO3:2 in the last 72 hours  Studies/Results: No results found.  Anti-infectives: Anti-infectives     Start     Dose/Rate Route Frequency Ordered Stop   04/03/11 1100   sulfamethoxazole-trimethoprim (BACTRIM DS) 800-160 MG per tablet 1 tablet  Status:  Discontinued        1 tablet Oral Every 12 hours 04/03/11 1043 04/05/11 1451   04/01/11 1800   ceFAZolin (ANCEF) IVPB 2 g/50 mL premix  Status:  Discontinued        2 g 100 mL/hr over 30 Minutes Intravenous 4 times per day 04/01/11 1724 04/05/11 1451          Assessment/Plan:   Pain better, WBC down.  I think that she could go home tomorrow.  If she relapses, I would try to reaspirate this area before resorting to open drainage.  CCS will be glad to see again PRN.     LOS: 6 days    Brittany Beltran B 04/07/2011

## 2011-04-08 DIAGNOSIS — N611 Abscess of the breast and nipple: Secondary | ICD-10-CM | POA: Clinically undetermined

## 2011-04-08 DIAGNOSIS — I10 Essential (primary) hypertension: Secondary | ICD-10-CM | POA: Diagnosis present

## 2011-04-08 MED ORDER — ALPRAZOLAM 0.25 MG PO TABS
0.2500 mg | ORAL_TABLET | Freq: Three times a day (TID) | ORAL | Status: DC | PRN
  Administered 2011-04-08: 0.25 mg via ORAL
  Filled 2011-04-08: qty 1

## 2011-04-08 MED ORDER — NIFEDIPINE ER 30 MG PO TB24
30.0000 mg | ORAL_TABLET | Freq: Once | ORAL | Status: AC
  Administered 2011-04-08: 30 mg via ORAL
  Filled 2011-04-08: qty 1

## 2011-04-08 MED ORDER — NIFEDIPINE ER OSMOTIC RELEASE 90 MG PO TB24
90.0000 mg | ORAL_TABLET | Freq: Every day | ORAL | Status: DC
  Administered 2011-04-09 – 2011-04-11 (×3): 90 mg via ORAL
  Filled 2011-04-08 (×3): qty 1

## 2011-04-08 NOTE — Progress Notes (Signed)
UR chart review completed.  

## 2011-04-08 NOTE — Progress Notes (Signed)
Vancomycin #3  Subjective: Patient reports worsening breast pain since this morning with sero-sanguinous discharge while pumping. Tearful +++ Has decided to d/c breastfeeding. Just seen by Dr Carolynne Edouard. I & D scheduled in the morning.  Objective: I have reviewed patient's vital signs, medications and microbiology. BP 160/92 Culture results from aspiration on 04/05/11: Staph Aureus sensitive to vancomycin.  Right Breast with 6 cm indurated erythematous area, extremely tender lower half of breast.   Assessment/Plan: 1.Right breast abcess, worsening 2.Poorly controlled HTN 3.Anxiety  1.I&D scheduled with Dr Carolynne Edouard tomorrow    Continue Vancomycin per protocol    Lactation consultation will be requested to aid in weaning BF 2. Increase Procardia XL to 90 mg daily 3. Xanax prn  LOS: 7 days    Quanah Majka A 04/08/2011, 2:14 PM

## 2011-04-09 ENCOUNTER — Encounter (HOSPITAL_COMMUNITY)
Admission: AD | Disposition: A | Discharge status: Home / Self Care | Payer: Self-pay | Source: Ambulatory Visit | Attending: Obstetrics and Gynecology

## 2011-04-09 ENCOUNTER — Inpatient Hospital Stay (HOSPITAL_COMMUNITY): Payer: Medicaid Other | Admitting: Anesthesiology

## 2011-04-09 ENCOUNTER — Encounter (HOSPITAL_COMMUNITY): Payer: Self-pay | Admitting: Anesthesiology

## 2011-04-09 LAB — CBC
MCH: 29.7 pg (ref 26.0–34.0)
MCHC: 33 g/dL (ref 30.0–36.0)
MCV: 90.2 fL (ref 78.0–100.0)
Platelets: 604 10*3/uL — ABNORMAL HIGH (ref 150–400)

## 2011-04-09 LAB — DIFFERENTIAL
Basophils Relative: 0 % (ref 0–1)
Eosinophils Absolute: 0.3 10*3/uL (ref 0.0–0.7)
Eosinophils Relative: 3 % (ref 0–5)
Monocytes Relative: 7 % (ref 3–12)
Neutrophils Relative %: 66 % (ref 43–77)

## 2011-04-09 SURGERY — INCISION AND DRAINAGE, ABSCESS
Site: Breast | Laterality: Right | Wound class: Dirty or Infected

## 2011-04-09 MED ORDER — LACTATED RINGERS IV SOLN
INTRAVENOUS | Status: DC | PRN
  Administered 2011-04-09: 10:00:00 via INTRAVENOUS

## 2011-04-09 MED ORDER — DEXAMETHASONE SODIUM PHOSPHATE 10 MG/ML IJ SOLN
INTRAMUSCULAR | Status: AC
  Filled 2011-04-09: qty 1

## 2011-04-09 MED ORDER — PROPOFOL 10 MG/ML IV EMUL
INTRAVENOUS | Status: DC | PRN
  Administered 2011-04-09: 160 mg via INTRAVENOUS

## 2011-04-09 MED ORDER — MIDAZOLAM HCL 2 MG/2ML IJ SOLN
INTRAMUSCULAR | Status: AC
  Filled 2011-04-09: qty 2

## 2011-04-09 MED ORDER — ONDANSETRON HCL 4 MG/2ML IJ SOLN: 4.0000 mg | Freq: Once | INTRAMUSCULAR | Status: DC | PRN

## 2011-04-09 MED ORDER — ACETAMINOPHEN 325 MG PO TABS: 325.0000 mg | ORAL_TABLET | ORAL | Status: DC | PRN

## 2011-04-09 MED ORDER — FENTANYL CITRATE 0.05 MG/ML IJ SOLN
INTRAMUSCULAR | Status: AC
  Filled 2011-04-09: qty 5

## 2011-04-09 MED ORDER — PROPOFOL 10 MG/ML IV EMUL
INTRAVENOUS | Status: AC
  Filled 2011-04-09: qty 20

## 2011-04-09 MED ORDER — FENTANYL CITRATE 0.05 MG/ML IJ SOLN
INTRAMUSCULAR | Status: AC
  Administered 2011-04-09: 50 ug via INTRAVENOUS
  Filled 2011-04-09: qty 2

## 2011-04-09 MED ORDER — LIDOCAINE HCL (CARDIAC) 20 MG/ML IV SOLN
INTRAVENOUS | Status: DC | PRN
  Administered 2011-04-09: 40 mg via INTRAVENOUS

## 2011-04-09 MED ORDER — LIDOCAINE HCL (CARDIAC) 20 MG/ML IV SOLN
INTRAVENOUS | Status: AC
  Filled 2011-04-09: qty 5

## 2011-04-09 MED ORDER — FENTANYL CITRATE 0.05 MG/ML IJ SOLN
25.0000 ug | INTRAMUSCULAR | Status: DC | PRN
  Administered 2011-04-09 (×2): 50 ug via INTRAVENOUS

## 2011-04-09 MED ORDER — MIDAZOLAM HCL 5 MG/5ML IJ SOLN
INTRAMUSCULAR | Status: DC | PRN
  Administered 2011-04-09: 2 mg via INTRAVENOUS

## 2011-04-09 MED ORDER — FENTANYL CITRATE 0.05 MG/ML IJ SOLN
INTRAMUSCULAR | Status: AC
  Filled 2011-04-09: qty 2

## 2011-04-09 MED ORDER — ONDANSETRON HCL 4 MG/2ML IJ SOLN
INTRAMUSCULAR | Status: AC
  Filled 2011-04-09: qty 2

## 2011-04-09 MED ORDER — MEPERIDINE HCL 25 MG/ML IJ SOLN: 6.2500 mg | INTRAMUSCULAR | Status: DC | PRN

## 2011-04-09 MED ORDER — DEXAMETHASONE SODIUM PHOSPHATE 10 MG/ML IJ SOLN
INTRAMUSCULAR | Status: DC | PRN
  Administered 2011-04-09: 10 mg via INTRAVENOUS

## 2011-04-09 MED ORDER — FENTANYL CITRATE 0.05 MG/ML IJ SOLN
INTRAMUSCULAR | Status: DC | PRN
  Administered 2011-04-09 (×3): 50 ug via INTRAVENOUS

## 2011-04-09 MED ORDER — ONDANSETRON HCL 4 MG/2ML IJ SOLN
INTRAMUSCULAR | Status: DC | PRN
  Administered 2011-04-09: 4 mg via INTRAVENOUS

## 2011-04-09 SURGICAL SUPPLY — 9 items
GLOVE INDICATOR 6.5 STRL GRN (GLOVE) ×2 IMPLANT
GLOVE SURG SS PI 7.5 STRL IVOR (GLOVE) ×2 IMPLANT
GOWN BRE IMP SLV AUR LG STRL (GOWN DISPOSABLE) ×2 IMPLANT
GOWN BRE IMP SLV AUR XL STRL (GOWN DISPOSABLE) ×2 IMPLANT
GOWN PREVENTION PLUS XLARGE (GOWN DISPOSABLE) ×2 IMPLANT
PACK ABDOMINAL MINOR (CUSTOM PROCEDURE TRAY) ×2 IMPLANT
PENCIL BUTTON HOLSTER BLD 10FT (ELECTRODE) ×2 IMPLANT
SYR BULB IRRIGATION 50ML (SYRINGE) ×2 IMPLANT
TUBING CONNECTING 10 (TUBING) ×2 IMPLANT

## 2011-04-09 NOTE — Anesthesia Preprocedure Evaluation (Signed)
Anesthesia Evaluation  Name, MR# and DOB Patient awake  General Assessment Comment  Reviewed: Allergy & Precautions, H&P , Patient's Chart, lab work & pertinent test results and reviewed documented beta blocker date and time   Airway Mallampati: II TM Distance: >3 FB Neck ROM: full    Dental No notable dental hx    Pulmonary  clear to auscultation  pulmonary exam normal   Cardiovascular regular Normal   Neuro/Psych  GI/Hepatic/Renal   Endo/Other   Abdominal   Musculoskeletal  Hematology   Peds  Reproductive/Obstetrics   Anesthesia Other Findings             Anesthesia Physical Anesthesia Plan  ASA: II  Anesthesia Plan: General   Post-op Pain Management:    Induction: Intravenous  Airway Management Planned: LMA  Additional Equipment:   Intra-op Plan:   Post-operative Plan:   Informed Consent: I have reviewed the patients History and Physical, chart, labs and discussed the procedure including the risks, benefits and alternatives for the proposed anesthesia with the patient or authorized representative who has indicated his/her understanding and acceptance.   Dental Advisory Given  Plan Discussed with: CRNA and Surgeon  Anesthesia Plan Comments: (  Discussed  general anesthesia, including possible nausea, instrumentation of airway, sore throat,pulmonary aspiration, etc. I asked if the were any outstanding questions, or  concerns before we proceeded. )        Anesthesia Quick Evaluation

## 2011-04-09 NOTE — Op Note (Signed)
Brittany Beltran, Brittany Beltran             ACCOUNT NO.:  [id number]  MEDICAL RECORD NO.:  [id number]  LOCATION:                                 FACILITY:  PHYSICIAN:  Ollen Gross. Vernell Morgans, M.D. DATE OF BIRTH:  [birth year]  DATE OF PROCEDURE:  04/09/2011 DATE OF DISCHARGE:                              OPERATIVE REPORT   PREOPERATIVE DIAGNOSIS:  Right breast abscess.  POSTOPERATIVE DIAGNOSIS:  Right breast abscess.  PROCEDURE:  Incision and drainage of right breast abscess.  SURGEON:  Ollen Gross. Vernell Morgans, MD  ANESTHESIA:  General via LMA.  PROCEDURE:  After informed consent was obtained, the patient brought to the operating room, placed in supine position on the operating table. After adequate induction of general anesthesia, the patient's right breast was prepped with ChloraPrep, allowed to dry and draped in usual sterile manner.  The abscess was in the 5 o'clock position on the right breast.  This area was incised in a radial fashion with a 10 blade knife.  Cultures were taken.  The purulent material was expressed and removed.  The cavity was fulgurated with electrocautery until it was hemostatic.  The wound was irrigated with copious amounts of saline and then packed with moistened Kerlix gauze.  Sterile dressings were applied.  The patient tolerated the procedure well.  At the end of the case, all needle, sponge, and instrument counts were correct.  The patient was then awakened, taken to recovery room in stable condition.     Ollen Gross. Vernell Morgans, M.D.     PST/MEDQ  D:  04/09/2011  T:  04/09/2011  Job:  [id number]

## 2011-04-09 NOTE — Transfer of Care (Signed)
Immediate Anesthesia Transfer of Care Note  Patient: Brittany Beltran  Procedure(s) Performed:  INCISION AND DRAINAGE ABSCESS  Patient Location: PACU  Anesthesia Type: General  Level of Consciousness: awake, alert , oriented and patient cooperative  Airway & Oxygen Therapy: Patient Spontanous Breathing and Patient connected to nasal cannula oxygen  Post-op Assessment: Report given to PACU RN, Post -op Vital signs reviewed and stable and Patient moving all extremities X 4  Post vital signs: Reviewed and stable  Complications: No apparent anesthesia complications

## 2011-04-09 NOTE — Consult Note (Signed)
ANTIBIOTIC CONSULT NOTE - FOLLOW UP  Pharmacy Consult for  Vancomycin Indication:  Mastitis  Patient Measurements: Height: 5\' 1"  (154.9 cm) Weight: 168 lb (76.204 kg) IBW/kg (Calculated) : 47.8  Adjusted Body Weight:   Vital Signs: Temp: 98 F (36.7 C) (07/24 1219) Temp src: Oral (07/24 1219) BP: 147/102 mmHg (07/24 1320) Pulse Rate: 81  (07/24 1320) Intake/Output from previous day: 07/23 0701 - 07/24 0700 In: 3475.8 [P.O.:1580; I.V.:1895.8] Out: 2925 [Urine:2925] Intake/Output from this shift: I/O this shift: In: 700 [I.V.:700] Out: 5 [Blood:5]  Labs:  [facility] 04/09/11 0528 04/07/11 0535  WBC 10.5 14.1*  HGB 9.1* 9.2*  PLT 604* 602*  LABCREA -- --  CREATININE -- --  CRCLEARANCE -- --    Basename 04/06/11 1523  VANCOTROUGH 13.0  VANCOPEAK --  VANCORANDOM --  GENTTROUGH --  GENTPEAK --  GENTRANDOM --  TOBRATROUGH --  TOBRAPEAK --  TOBRARND --  AMIKACINPEAK --  AMIKACINTROU --  AMIKACIN --     Microbiology: No results found for this or any previous visit (from the past 720 hour(s)).  Anti-infectives     Start     Dose/Rate Route Frequency Ordered Stop   04/03/11 1100   sulfamethoxazole-trimethoprim (BACTRIM DS) 800-160 MG per tablet 1 tablet  Status:  Discontinued        1 tablet Oral Every 12 hours 04/03/11 1043 04/05/11 1451   04/01/11 1800   ceFAZolin (ANCEF) IVPB 2 g/50 mL premix  Status:  Discontinued        2 g 100 mL/hr over 30 Minutes Intravenous 4 times per day 04/01/11 1724 04/05/11 1451          Assessment: Post Op/ Drained abcess today.   Goal of Therapy: Trough 13-15 mg/L   Plan: Continue current regimen.  Will follow per protocol.   Hovey-Rankin, Geniene List 04/09/2011,1:35 PM

## 2011-04-09 NOTE — Anesthesia Postprocedure Evaluation (Signed)
  Anesthesia Post-op Note  Patient: Brittany Beltran  Procedure(s) Performed:  INCISION AND DRAINAGE ABSCESS  Patient is awake and responsive. Pain and nausea are reasonably well controlled. Vital signs are stable and clinically acceptable. Oxygen saturation is clinically acceptable. There are no apparent anesthetic complications at this time. Patient is ready for discharge.

## 2011-04-10 MED ORDER — DOXYCYCLINE HYCLATE 100 MG PO TABS
100.0000 mg | ORAL_TABLET | Freq: Two times a day (BID) | ORAL | Status: DC
  Administered 2011-04-10 – 2011-04-11 (×4): 100 mg via ORAL
  Filled 2011-04-10 (×3): qty 1

## 2011-04-10 NOTE — Progress Notes (Signed)
Subjective: Patient reports incisional pain well controlled, tolerating PO,  and no problems voiding.    Objective: I have reviewed patient's vital signs, intake and output, medications, labs, microbiology, pathology and radiology results.  General: alert Breast R breast bandaged no erythema Resp: clear to auscultation bilaterally Cardio: regular rate and rhythm, S1, S2 normal, no murmur, click, rub or gallop GI: soft, non-tender; bowel sounds normal; no masses,  no organomegaly Extremities: extremities normal, atraumatic, no cyanosis or edema Vaginal Bleeding: minimal   Assessment/Plan: S/PIandD of breast abscess Cultures pending Plan dc tomorrow with doxycycline Follow up with surgery in two weeks Pt stable will get Vancomycin until tomorrow  LOS: 9 days    Brittany Beltran A 04/10/2011, 12:39 PM

## 2011-04-11 ENCOUNTER — Telehealth (INDEPENDENT_AMBULATORY_CARE_PROVIDER_SITE_OTHER): Payer: Self-pay | Admitting: General Surgery

## 2011-04-11 MED ORDER — DOXYCYCLINE HYCLATE 100 MG PO TABS: 100.0000 mg | ORAL_TABLET | Freq: Two times a day (BID) | ORAL | Status: AC

## 2011-04-11 MED ORDER — NIFEDIPINE ER OSMOTIC RELEASE 90 MG PO TB24: 90.0000 mg | ORAL_TABLET | Freq: Every day | ORAL | Status: DC

## 2011-04-11 MED ORDER — ZOLPIDEM TARTRATE 5 MG PO TABS: 5.0000 mg | ORAL_TABLET | Freq: Every evening | ORAL | Status: DC | PRN

## 2011-04-11 MED ORDER — NORETHIN-ETH ESTRADIOL-FE 0.8-25 MG-MCG PO CHEW: 1.0000 | CHEWABLE_TABLET | Freq: Every day | ORAL | Status: DC

## 2011-04-11 MED ORDER — ALPRAZOLAM 0.25 MG PO TABS: 0.2500 mg | ORAL_TABLET | Freq: Three times a day (TID) | ORAL | Status: DC | PRN

## 2011-04-11 NOTE — Progress Notes (Signed)
Referral received by CM for [facility] RN for NS wet to dry dressing changes BID beginning 04/11/11 pm. Choice offered to patient. Chose [facility]. Referral sent through [facility] and spoke with Norberta Keens, RN with [facility]. Patient in process of being discharged. [facility] to contact patient to schedule initial dressing change this pm. Informed HH agency that F2F form was not completed by MD. They will follow up with completion of F2F.

## 2011-04-11 NOTE — Progress Notes (Signed)
  2 Days Post-Op Procedure(s) (LRB): INCISION AND DRAINAGE ABSCESS (Right)  Subjective: Patient reports that pain is well managed.  Tolerating normal diet as tolerated  diet without difficulty. No nausea / vomiting.  Ambulating and voiding. Lochia normal.  Objective: BP 134/97  Pulse 95  Temp(Src) 98.4 F (36.9 C) (Oral)  Resp 18  Ht 5\' 1"  (1.549 m)  Wt 76.204 kg (168 lb)  BMI 31.74 kg/m2  SpO2 98%  Extremities: Homans sign is negative, no sign of DVT Incision: covered. Reported normal by nurse who just changed dressing. Assessment: s/p Procedure(s): INCISION AND DRAINAGE ABSCESS: progressing well  Plan: Discharge home  LOS: 10 days    Alrick Cubbage A 04/11/2011, 11:35 AM

## 2011-04-11 NOTE — Progress Notes (Addendum)
Received call from Norberta Keens with [facility] after patient's discharge that they are unable to provide dressing changes this pm as initially indicated. CM recommended [facility] contact Dr. Carolynne Edouard for further order as directed by Dr. Estanislado Pandy at discharge. [facility] to make CM aware of follow-up plan. [facility] has notified patient per Norberta Keens.  [facility] unable to reach Dr. Carolynne Edouard directly. Message left at his office. CM encouraged call to Dr. Estanislado Pandy. Notified by Norberta Keens with [facility] that after speaking with Dr. Estanislado Pandy they were able to arrange staffing for wound care beginning the pm of 04/11/11.

## 2011-04-11 NOTE — Telephone Encounter (Signed)
Patient had I&D breast abscess by Dr Carolynne Edouard. He ordered home health with BID dressing changes starting this evening. Per [facility] they do not have the staff to start this evening but will send someone out first thing in the morning. Please call Darl Pikes with any questions.

## 2011-04-11 NOTE — Discharge Summary (Signed)
  Physician Discharge Summary  Patient ID: Brittany Beltran MRN: [id number] DOB/AGE: [birth year] 29 y.o.  Admit date: 04/01/2011 Discharge date: 04/11/2011  Admission Diagnoses: Right Breast Mastitis  Discharge Diagnoses:   Right Breast abscess          Hypertension   Discharged Condition: stable  Hospital Course: IV antibiotics followed by Incision and Drainage on July 24  Consults: Dr Chevis Pretty  Disposition: Home or Self Care  Discharge Orders    Future Orders Please Complete By Expires   Ambulatory referral to Home Health      Comments:   Please evaluate [name] for admission to [facility].  Disciplines requested: Nursing  Services to provide: Patient needs Normal saline wet to dry dressing change twice daily until discontinued by Dr Chevis Pretty   Physician to follow patient's care (the person listed here will be responsible for signing ongoing orders): Other: Dr Chevis Pretty  Requested Start of Care Date: Today (Please call ahead to confirm availability [phone]) dressing change needed today in PM  Special Instructions:       Current Discharge Medication List    START taking these medications   Details  ALPRAZolam (XANAX) 0.25 MG tablet Take 1 tablet (0.25 mg total) by mouth 3 (three) times daily as needed for anxiety. Qty: 15 tablet, Refills: 0    doxycycline (VIBRA-TABS) 100 MG tablet Take 1 tablet (100 mg total) by mouth every 12 (twelve) hours. Qty: 12 tablet, Refills: 0    NIFEdipine (PROCARDIA XL/ADALAT-CC) 90 MG 24 hr tablet Take 1 tablet (90 mg total) by mouth daily. Qty: 30 tablet, Refills: 3    Norethindrone-Ethinyl Estradiol-Fe (GENERESS FE) 0.8-25 MG-MCG tablet Chew 1 tablet by mouth daily. Qty: 1 Package, Refills: 11    zolpidem (AMBIEN) 5 MG tablet Take 1 tablet (5 mg total) by mouth at bedtime as needed for sleep. Qty: 14 tablet, Refills: 0      CONTINUE these medications which have NOT CHANGED   Details  ibuprofen (ADVIL,MOTRIN)  800 MG tablet Take 800 mg by mouth every 8 (eight) hours as needed.      IRON PO Take 1 tablet by mouth daily.      Multiple Vitamin (MULTIVITAMIN PO) Take 1 tablet by mouth daily.      oxyCODONE-acetaminophen (PERCOCET) 5-325 MG per tablet Take 1 tablet by mouth every 4 (four) hours as needed.      Probiotic Product (PROBIOTIC PO) Take 1 tablet by mouth daily.      ranitidine (ZANTAC) 150 MG tablet Take 150 mg by mouth 2 (two) times daily.      sertraline (ZOLOFT) 50 MG tablet Take 50 mg by mouth daily. Patient states that she takes half a tab.       STOP taking these medications     dicloxacillin (DYNAPEN) 250 MG capsule      fluconazole (DIFLUCAN) 150 MG tablet      ondansetron (ZOFRAN) 8 MG tablet        Follow-up Information    Follow up with TOTH III,PAUL S, MD. Call in 1 week.   Contact information:   [employer] [address] [place] [address] [phone]          Signed: Esmeralda Arthur, MD 04/11/2011, 12:00 PM

## 2011-04-12 LAB — CULTURE, ROUTINE-ABSCESS

## 2011-04-12 NOTE — Telephone Encounter (Signed)
Home health can come out once a day. She should remove packing and shower before they come. Nurses should try to teach family to do it the second time each day.

## 2011-04-14 LAB — ANAEROBIC CULTURE

## 2011-04-23 ENCOUNTER — Encounter (INDEPENDENT_AMBULATORY_CARE_PROVIDER_SITE_OTHER): Payer: Self-pay | Admitting: General Surgery

## 2011-04-23 ENCOUNTER — Ambulatory Visit (INDEPENDENT_AMBULATORY_CARE_PROVIDER_SITE_OTHER): Payer: Medicaid Other | Admitting: General Surgery

## 2011-04-23 DIAGNOSIS — N61 Mastitis without abscess: Secondary | ICD-10-CM

## 2011-04-23 DIAGNOSIS — N611 Abscess of the breast and nipple: Secondary | ICD-10-CM

## 2011-04-23 NOTE — Progress Notes (Signed)
Subjective:     Patient ID: Brittany Beltran, female   DOB: 1981-06-15, 30 y.o.   MRN: [id number]  HPI The patient is a 30 year old white female who is now a week or 2 out from incision and drainage from her right breast abscess. She is been showering everyday and her husband has been doing the dressing changes twice a day. She still has a lot of soreness associated with the right breast. She's had minimal drainage. She denies any fevers  Review of Systems     Objective:   Physical Exam On exam her right breast wound is very small and very clean. There is good granulation tissue in the bed. It is actually quite shallow. We redressed it today and she tolerated this well.    Assessment:     Postop from an incision and drainage of the right breast abscess.    Plan:     She will continue to shower and do daily dressing changes. Given her another prescription for Percocet today for her pain. We'll plan to see her back in about 2 weeks to check her progress.

## 2011-04-23 NOTE — Patient Instructions (Signed)
Continue to shower daily Continue dressing changes daily  F/U in 2 weeks

## 2011-05-01 ENCOUNTER — Encounter (HOSPITAL_COMMUNITY): Payer: Self-pay | Admitting: *Deleted

## 2011-05-01 ENCOUNTER — Observation Stay (HOSPITAL_COMMUNITY)
Admission: AD | Admit: 2011-05-01 | Discharge: 2011-05-02 | DRG: 776 | Disposition: A | Discharge status: Other Acute Inpatient Hospital | Payer: Medicaid Other | Source: Ambulatory Visit | Attending: Obstetrics and Gynecology | Admitting: Obstetrics and Gynecology

## 2011-05-01 DIAGNOSIS — F53 Postpartum depression: Secondary | ICD-10-CM

## 2011-05-01 DIAGNOSIS — F3289 Other specified depressive episodes: Secondary | ICD-10-CM | POA: Diagnosis present

## 2011-05-01 DIAGNOSIS — O99345 Other mental disorders complicating the puerperium: Principal | ICD-10-CM | POA: Diagnosis present

## 2011-05-01 DIAGNOSIS — F329 Major depressive disorder, single episode, unspecified: Secondary | ICD-10-CM | POA: Diagnosis present

## 2011-05-01 LAB — URINALYSIS, DIPSTICK ONLY
Hgb urine dipstick: NEGATIVE
Nitrite: NEGATIVE
Specific Gravity, Urine: 1.025 (ref 1.005–1.030)
Urobilinogen, UA: 0.2 mg/dL (ref 0.0–1.0)

## 2011-05-01 LAB — CBC
HCT: 32.3 % — ABNORMAL LOW (ref 36.0–46.0)
Hemoglobin: 10.8 g/dL — ABNORMAL LOW (ref 12.0–15.0)
RBC: 3.63 MIL/uL — ABNORMAL LOW (ref 3.87–5.11)
RDW: 13.7 % (ref 11.5–15.5)
WBC: 10.5 10*3/uL (ref 4.0–10.5)

## 2011-05-01 LAB — RAPID URINE DRUG SCREEN, HOSP PERFORMED
Barbiturates: NOT DETECTED
Benzodiazepines: NOT DETECTED

## 2011-05-01 LAB — COMPREHENSIVE METABOLIC PANEL
Albumin: 4.2 g/dL (ref 3.5–5.2)
Alkaline Phosphatase: 70 U/L (ref 39–117)
BUN: 13 mg/dL (ref 6–23)
CO2: 25 mEq/L (ref 19–32)
Chloride: 98 mEq/L (ref 96–112)
GFR calc non Af Amer: >60 mL/min (ref >60)
Glucose, Bld: 97 mg/dL (ref 70–99)
Potassium: 2.8 mEq/L — ABNORMAL LOW (ref 3.5–5.1)
Total Bilirubin: 0.2 mg/dL — ABNORMAL LOW (ref 0.3–1.2)

## 2011-05-01 LAB — POCT PREGNANCY, URINE: Preg Test, Ur: NEGATIVE

## 2011-05-01 LAB — ETHANOL: Alcohol, Ethyl (B): <11 mg/dL (ref 0–11)

## 2011-05-01 MED ORDER — METHYLDOPA 250 MG PO TABS
250.0000 mg | ORAL_TABLET | Freq: Two times a day (BID) | ORAL | Status: DC
  Administered 2011-05-01: 250 mg via ORAL
  Filled 2011-05-01 (×3): qty 1

## 2011-05-01 MED ORDER — TRAZODONE HCL 100 MG PO TABS: 100.0000 mg | ORAL_TABLET | Freq: Every day | ORAL | Status: DC

## 2011-05-01 MED ORDER — TRAZODONE HCL 50 MG PO TABS
100.0000 mg | ORAL_TABLET | Freq: Once | ORAL | Status: AC
  Administered 2011-05-01: 100 mg via ORAL
  Filled 2011-05-01: qty 2

## 2011-05-01 MED ORDER — POTASSIUM CHLORIDE CRYS ER 20 MEQ PO TBCR
20.0000 meq | EXTENDED_RELEASE_TABLET | Freq: Every day | ORAL | Status: DC
  Administered 2011-05-01: 20 meq via ORAL
  Filled 2011-05-01 (×2): qty 1

## 2011-05-01 NOTE — Progress Notes (Signed)
Talked with Tammy Sours with BH/[facility] declined pt, [facility] has no beds,  no beds. Information has been sent to [facility], H Steelman CNM notified and will see pt in MAU.

## 2011-05-01 NOTE — Plan of Care (Signed)
ACT Team called at 2140 to report patient has been accepted for transfer to Behavioral Health at [facility] by Dr. Malvin Johns.  Pt had just been sent to 3rd floor secondary to need to free up beds in MAU.  Patient has follow-up scheduled with Dr. Normand Sloop at [facility].

## 2011-05-01 NOTE — Progress Notes (Signed)
Manfred Arch CNM  K-2.8. CNM will enter orders for [facility] and B/P medication.

## 2011-05-01 NOTE — Progress Notes (Signed)
Resumed care of pt at 1700, and during this time, ACT team personnel has continued to find patient a facility which will accept patient's pregnancy medicaid for inpatient therapy.  Pt has been stable and BP normotensive since she had [place].  She has just finished eating supper.  Husband was at bs, but just stepped out recently.  Currently "Naja" with behavioral health is working on contacting [facility] to see if they will accept her as a transfer to their Postpartum center.  Dr. Normand Sloop aware of current status and patient just informed of continued efforts to find transferring facility.

## 2011-05-01 NOTE — Progress Notes (Signed)
Tammy Sours with ACT team called and has a bed at [facility]. Dr. Jeannine Kitten accepting MD.  Natalia Leatherwood RN notified. H Steelman CNM notified in plan of care.

## 2011-05-01 NOTE — Progress Notes (Signed)
Talked with Tammy Sours with the ACT team, pt desires clarification about whether she has the [facility] along with the Pregnancy Medicaid. ACT team will call back.

## 2011-05-01 NOTE — Progress Notes (Signed)
Manfred Arch CNM on unit, ACT team paged.

## 2011-05-01 NOTE — ED Provider Notes (Signed)
History   Patient is 30 yo G1P1 who delivered vaginally on 03/15/11, presenting to MAU with worsening postpartum depression.  Seen at [facility] office today with PP Depression scale = 28.  Denies any thoughts or intent to harm baby or others, but does report thoughts of self-harm.    History remarkable for: PP depression, with patient already on Zoloft and Tranzadone from therapist HTN, with patient on Procardia 90 mg XL q day since early postpartum  Hx MRSA mastitis, with hospitalization 2 1/2 weeks after delivery for control of severe mastitis. Hypokalemia, with K+ 2.8 today Significant weight loss since delivery, likely due to PP depression, with current weight 89 Family stressors  Chief Complaint  Patient presents with  . Depression   HPI:  Had an uncomplicated prenatal course, and a normal vaginal delivery attended by the CNM service at [facility] on 9/29.  Began to have worsening pp depression within the next 1-2 weeks, then began to have sx of mastitis that did not respond to normal ATB regimens.  Admitted for 2 1/2 weeks, with eventual diagnosis of MRSA, I&D of an absess, and diagnosis of pp hypertension, with initiation of Procardia 90 mg XL.  Has continued to have pp depression, and was seen by a therapist in [place], with intiation of Zoloft and Tranzadone.  Symptoms have continued to worsen, with patient notifying our office of this issue.  Requested evaluation by the ACT Team in MAU today.  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  Would prefer to not be admitted there if possible, due to personal relationships.  Denies any homicidal ideations, but has "thought about hurting myself" at times.  OB History    Grav Para Term Preterm Abortions TAB SAB Ect Mult Living   1             Had vaginal delivery on 03/15/11 of viable baby.  Past Medical History  Diagnosis Date  . Hypertension   .  Anxiety   . Depression     post partum  . Allergy   . Breast abscess   . Mastitis     Past Surgical History  Procedure Date  . Incise and drain abcess   . Mouth surgery   . Incision and drainage breast abscess     Family History  Problem Relation Age of Onset  . Cancer Mother     breast  . Alcohol abuse Sister     History  Substance Use Topics  . Smoking status: Never Smoker   . Smokeless tobacco: Not on file  . Alcohol Use: No    Allergies:  Allergies  Allergen Reactions  . Latex   . Promethazine Hcl     REACTION: psych effects  . Vancomycin     Redness, swelling, and itching     Prescriptions prior to admission  Medication Sig Dispense Refill  . ibuprofen (ADVIL,MOTRIN) 800 MG tablet Take 800 mg by mouth every 8 (eight) hours as needed. pain      . IRON PO Take 1 tablet by mouth daily.        Marland Kitchen NIFEdipine (PROCARDIA XL/ADALAT-CC) 90 MG 24 hr tablet Take 1 tablet (90 mg total) by mouth daily.  30 tablet  3  . oxyCODONE-acetaminophen (PERCOCET) 5-325 MG per tablet Take 1 tablet by mouth every 6 (six) hours as needed. pain      . sertraline (ZOLOFT) 100 MG tablet Take 100 mg by  mouth daily.        . traZODone (DESYREL) 100 MG tablet Take 100 mg by mouth at bedtime.        . Multiple Vitamin (MULTIVITAMIN PO) Take 1 tablet by mouth daily.        . Norethindrone-Ethinyl Estradiol-Fe (GENERESS FE) 0.8-25 MG-MCG tablet Chew 1 tablet by mouth daily.  1 Package  11  . Probiotic Product (PROBIOTIC PO) Take 1 tablet by mouth daily.        . sertraline (ZOLOFT) 50 MG tablet Take 100 mg by mouth daily. Patient states that she takes half a tab.        ROS Physical Exam   Blood pressure 141/99, pulse 103, temperature 98.1 F (36.7 C), temperature source Oral, resp. rate 16, height 5' (1.524 m), weight 39.1 kg (86 lb 3.2 oz), SpO2 100.00%.  Physical Exam Chest clear Heart RRR without murmur Abdomen--soft, NT Back--Negative CVAT Pelvic--deferred Extremities--DTR 1+  without clonus, no edema  Labs:  UA negative UDS negative CBC WNL CMP remarkable for K+ 2.8 UPT negative  MAU Course  Procedures    Assessment and Plan  6 wks post vaginal delivery, with severe pp depression (PPDS = 28) Hypokalemia Hypertension--on Procardia 90 mg XL q day  Plan: ACT team evaluated patient in MAU and recommended inpatient status--they will coordinate placement. Per consult with Dr. Normand Sloop, add Aldomet 250 mg po BID, 1st dose now. Add Kdur 20 meq q day x 7 days, with re-evaluation of level after that. Transfer patient from MAU to receiving facility when bed obtained. Patient agreeable with plan.  Cinda Hara L 05/01/2011, 3:00 PM

## 2011-05-01 NOTE — Progress Notes (Signed)
Pt states she delivered 6-29, SVD. Had to be readmitted for mastitis and was in the hospital for 2 1/2 weeks, diagnosed with MRSA. Has had a lot of social issued and is severely depressed and feels like she is unable to care for her baby. Pt states she loves her baby and would never do anything to hurt anyone except herself. At times she feels like hurting herself but has had no attempts. Pt is having no physical issues today.

## 2011-05-01 NOTE — Progress Notes (Signed)
Informed pt that [facility] may have a room available for post-partum patients. Pt states " I am fine with that plan." H Steelman CNM aware of change. No new orders at this time.

## 2011-05-01 NOTE — Progress Notes (Signed)
Pt transferred via wc to room 316. Report given to  Gillian Shields Rn.

## 2011-05-01 NOTE — ED Notes (Signed)
Notified by Baxter Hire with ACT team that the plan is for pt to be tx. To [facility] in [place]. Pt notified of change and agrees with the plan.

## 2011-05-01 NOTE — ED Notes (Signed)
Pt very tearful, warm blankets and fluid offered, V Emilee Hero CNM and [place] ACT team at bedside.

## 2011-05-07 ENCOUNTER — Ambulatory Visit (INDEPENDENT_AMBULATORY_CARE_PROVIDER_SITE_OTHER): Payer: Medicaid Other | Admitting: General Surgery

## 2011-05-07 ENCOUNTER — Encounter (INDEPENDENT_AMBULATORY_CARE_PROVIDER_SITE_OTHER): Payer: Self-pay | Admitting: General Surgery

## 2011-05-07 DIAGNOSIS — N61 Mastitis without abscess: Secondary | ICD-10-CM

## 2011-05-07 DIAGNOSIS — N611 Abscess of the breast and nipple: Secondary | ICD-10-CM

## 2011-05-07 NOTE — Patient Instructions (Signed)
May return to all normal activities 

## 2011-05-07 NOTE — Progress Notes (Signed)
Subjective:     Patient ID: Brittany Beltran, female   DOB: 1980-11-18, 30 y.o.   MRN: [id number]  HPI The patient is a 30 year old white female who is now about a month out from incision and drainage of a right breast abscess. Since her last visit she feels much better now. She is no longer requiring any Percocet. She has not had any drainage from the area.  Review of Systems     Objective:   Physical Exam On exam her right breast wound is completely healed. She still has some firm thick tissue around the scar but no sign of infection.   Assessment:     One month postop from an incision and drainage of a right breast abscess    Plan:     At this point I believe she can return to all her normal activities without any restrictions and we will plan to see her back on a p.r.n. basis.

## 2011-11-20 ENCOUNTER — Ambulatory Visit: Payer: Self-pay | Admitting: Obstetrics and Gynecology

## 2011-11-21 ENCOUNTER — Ambulatory Visit: Payer: Self-pay | Admitting: Obstetrics and Gynecology

## 2011-12-03 ENCOUNTER — Encounter: Payer: Self-pay | Admitting: Obstetrics and Gynecology

## 2011-12-11 ENCOUNTER — Encounter (INDEPENDENT_AMBULATORY_CARE_PROVIDER_SITE_OTHER): Payer: Self-pay | Admitting: Obstetrics and Gynecology

## 2011-12-11 DIAGNOSIS — Z01419 Encounter for gynecological examination (general) (routine) without abnormal findings: Secondary | ICD-10-CM

## 2011-12-11 DIAGNOSIS — Z3041 Encounter for surveillance of contraceptive pills: Secondary | ICD-10-CM

## 2012-05-09 ENCOUNTER — Emergency Department (HOSPITAL_COMMUNITY): Payer: Medicaid Other

## 2012-05-09 ENCOUNTER — Inpatient Hospital Stay (HOSPITAL_COMMUNITY)
Admission: EM | Admit: 2012-05-09 | Discharge: 2012-05-11 | DRG: 494 | Disposition: A | Discharge status: Home / Self Care | Payer: Medicaid Other | Attending: Orthopedic Surgery | Admitting: Orthopedic Surgery

## 2012-05-09 ENCOUNTER — Encounter (HOSPITAL_COMMUNITY): Payer: Self-pay

## 2012-05-09 DIAGNOSIS — S82899A Other fracture of unspecified lower leg, initial encounter for closed fracture: Secondary | ICD-10-CM

## 2012-05-09 DIAGNOSIS — I10 Essential (primary) hypertension: Secondary | ICD-10-CM | POA: Diagnosis present

## 2012-05-09 DIAGNOSIS — Z79899 Other long term (current) drug therapy: Secondary | ICD-10-CM

## 2012-05-09 DIAGNOSIS — W108XXA Fall (on) (from) other stairs and steps, initial encounter: Secondary | ICD-10-CM | POA: Diagnosis present

## 2012-05-09 DIAGNOSIS — S82853A Displaced trimalleolar fracture of unspecified lower leg, initial encounter for closed fracture: Principal | ICD-10-CM | POA: Diagnosis present

## 2012-05-09 DIAGNOSIS — F411 Generalized anxiety disorder: Secondary | ICD-10-CM | POA: Diagnosis present

## 2012-05-09 DIAGNOSIS — S82851A Displaced trimalleolar fracture of right lower leg, initial encounter for closed fracture: Secondary | ICD-10-CM

## 2012-05-09 DIAGNOSIS — E876 Hypokalemia: Secondary | ICD-10-CM | POA: Diagnosis present

## 2012-05-09 LAB — URINALYSIS, ROUTINE W REFLEX MICROSCOPIC
Bilirubin Urine: NEGATIVE
Nitrite: NEGATIVE
Specific Gravity, Urine: 1.02 (ref 1.005–1.030)
pH: 6 (ref 5.0–8.0)

## 2012-05-09 LAB — CBC WITH DIFFERENTIAL/PLATELET
Basophils Absolute: 0 10*3/uL (ref 0.0–0.1)
Eosinophils Relative: 2 % (ref 0–5)
Lymphocytes Relative: 25 % (ref 12–46)
Lymphs Abs: 2.4 10*3/uL (ref 0.7–4.0)
MCV: 90 fL (ref 78.0–100.0)
Neutro Abs: 6.4 10*3/uL (ref 1.7–7.7)
Platelets: 333 10*3/uL (ref 150–400)
RBC: 4.08 MIL/uL (ref 3.87–5.11)
RDW: 12.8 % (ref 11.5–15.5)
WBC: 9.6 10*3/uL (ref 4.0–10.5)

## 2012-05-09 LAB — PREGNANCY, URINE: Preg Test, Ur: NEGATIVE

## 2012-05-09 LAB — URINE MICROSCOPIC-ADD ON

## 2012-05-09 MED ORDER — ETOMIDATE 2 MG/ML IV SOLN
INTRAVENOUS | Status: AC
  Administered 2012-05-09: 8 mg
  Filled 2012-05-09: qty 10

## 2012-05-09 MED ORDER — FENTANYL CITRATE 0.05 MG/ML IJ SOLN
100.0000 ug | Freq: Once | INTRAMUSCULAR | Status: AC
  Administered 2012-05-09: 100 ug via INTRAVENOUS
  Filled 2012-05-09: qty 2

## 2012-05-09 MED ORDER — ONDANSETRON HCL 4 MG/2ML IJ SOLN: 4.0000 mg | Freq: Four times a day (QID) | INTRAMUSCULAR | Status: DC | PRN

## 2012-05-09 MED ORDER — POTASSIUM CHLORIDE CRYS ER 20 MEQ PO TBCR
20.0000 meq | EXTENDED_RELEASE_TABLET | Freq: Three times a day (TID) | ORAL | Status: DC
  Administered 2012-05-09 – 2012-05-10 (×2): 20 meq via ORAL
  Filled 2012-05-09 (×2): qty 1

## 2012-05-09 MED ORDER — ONDANSETRON HCL 4 MG/2ML IJ SOLN
4.0000 mg | Freq: Once | INTRAMUSCULAR | Status: AC
  Administered 2012-05-09: 4 mg via INTRAVENOUS
  Filled 2012-05-09: qty 2

## 2012-05-09 MED ORDER — HYDROMORPHONE HCL PF 1 MG/ML IJ SOLN
0.5000 mg | INTRAMUSCULAR | Status: DC | PRN
  Administered 2012-05-10 – 2012-05-11 (×2): 1 mg via INTRAVENOUS
  Administered 2012-05-11 (×2): 0.5 mg via INTRAVENOUS
  Filled 2012-05-09 (×5): qty 1

## 2012-05-09 MED ORDER — FENTANYL CITRATE 0.05 MG/ML IJ SOLN
INTRAMUSCULAR | Status: AC
  Filled 2012-05-09: qty 2

## 2012-05-09 MED ORDER — OXYCODONE-ACETAMINOPHEN 5-325 MG PO TABS
1.0000 | ORAL_TABLET | ORAL | Status: DC
  Administered 2012-05-10 – 2012-05-11 (×10): 1 via ORAL
  Filled 2012-05-09 (×11): qty 1

## 2012-05-09 MED ORDER — METHYLDOPA 250 MG PO TABS
250.0000 mg | ORAL_TABLET | Freq: Two times a day (BID) | ORAL | Status: DC
  Administered 2012-05-10: 250 mg via ORAL
  Filled 2012-05-09 (×4): qty 1

## 2012-05-09 MED ORDER — KETOROLAC TROMETHAMINE 30 MG/ML IJ SOLN
30.0000 mg | Freq: Four times a day (QID) | INTRAMUSCULAR | Status: DC
  Administered 2012-05-10 (×3): 30 mg via INTRAVENOUS
  Filled 2012-05-09 (×3): qty 1

## 2012-05-09 MED ORDER — HYDROMORPHONE HCL PF 1 MG/ML IJ SOLN: 0.5000 mg | INTRAMUSCULAR | Status: DC | PRN

## 2012-05-09 MED ORDER — MIRTAZAPINE 15 MG PO TABS
15.0000 mg | ORAL_TABLET | Freq: Every day | ORAL | Status: DC
  Administered 2012-05-10: 15 mg via ORAL
  Filled 2012-05-09 (×2): qty 1

## 2012-05-09 MED ORDER — BUSPIRONE HCL 5 MG PO TABS
15.0000 mg | ORAL_TABLET | Freq: Three times a day (TID) | ORAL | Status: DC
  Administered 2012-05-10 – 2012-05-11 (×6): 15 mg via ORAL
  Filled 2012-05-09 (×2): qty 1
  Filled 2012-05-09: qty 3
  Filled 2012-05-09 (×3): qty 1
  Filled 2012-05-09: qty 3
  Filled 2012-05-09: qty 1
  Filled 2012-05-09 (×3): qty 3

## 2012-05-09 MED ORDER — NORETHIN-ETH ESTRADIOL-FE 0.8-25 MG-MCG PO CHEW: 1.0000 | CHEWABLE_TABLET | Freq: Every day | ORAL | Status: DC

## 2012-05-09 MED ORDER — NIFEDIPINE ER OSMOTIC RELEASE 30 MG PO TB24: 90.0000 mg | ORAL_TABLET | Freq: Every day | ORAL | Status: DC

## 2012-05-09 MED ORDER — FENTANYL CITRATE 0.05 MG/ML IJ SOLN
50.0000 ug | Freq: Once | INTRAMUSCULAR | Status: AC
  Administered 2012-05-09: 50 ug via INTRAVENOUS

## 2012-05-09 MED ORDER — KCL IN DEXTROSE-NACL 40-5-0.9 MEQ/L-%-% IV SOLN
INTRAVENOUS | Status: DC
  Administered 2012-05-10 (×2): via INTRAVENOUS
  Filled 2012-05-09 (×5): qty 1000

## 2012-05-09 NOTE — ED Provider Notes (Addendum)
History     CSN: [id number]  Arrival date & time 05/09/12  2047   First MD Initiated Contact with Patient 05/09/12 2100      Chief Complaint  Patient presents with  . Ankle Injury    (Consider location/radiation/quality/duration/timing/severity/associated sxs/prior treatment) HPI Patient complaining of pain right ankle. She tripped and fell tonight. She twisted the right ankle and has a notable deformity. She denies striking her head or any pain in her neck. Past Medical History  Diagnosis Date  . Hypertension   . Anxiety   . Depression     post partum  . Allergy   . Breast abscess   . Mastitis     Past Surgical History  Procedure Date  . Incise and drain abcess   . Mouth surgery   . Incision and drainage breast abscess     Family History  Problem Relation Age of Onset  . Cancer Mother     breast  . Alcohol abuse Sister     History  Substance Use Topics  . Smoking status: Never Smoker   . Smokeless tobacco: Not on file  . Alcohol Use: No    OB History    Grav Para Term Preterm Abortions TAB SAB Ect Mult Living   1               Review of Systems  All other systems reviewed and are negative.    Allergies  Latex; Promethazine hcl; and Vancomycin  Home Medications   Current Outpatient Rx  Name Route Sig Dispense Refill  . BUSPIRONE HCL 15 MG PO TABS Oral Take 15 mg by mouth 3 (three) times daily.      Marland Kitchen MIRTAZAPINE 15 MG PO TABS Oral Take 15 mg by mouth at bedtime.    . MULTIVITAMIN PO Oral Take 1 tablet by mouth daily.      . IBUPROFEN 800 MG PO TABS Oral Take 800 mg by mouth every 8 (eight) hours as needed. pain    . IRON PO Oral Take 1 tablet by mouth daily.      . L-METHYLFOLATE CALCIUM 15 MG PO TABS Oral Take by mouth daily.      . METHYLDOPA 250 MG PO TABS Oral Take 250 mg by mouth 2 (two) times daily.      Marland Kitchen NIFEDIPINE ER OSMOTIC 90 MG PO TB24 Oral Take 1 tablet (90 mg total) by mouth daily. 30 tablet 3  . NORETHIN-ETH ESTRADIOL-FE 0.8-25  MG-MCG PO CHEW Oral Chew 1 tablet by mouth daily. 1 Package 11  . PROBIOTIC PO Oral Take 1 tablet by mouth daily.      . SERTRALINE HCL 100 MG PO TABS Oral Take 100 mg by mouth daily.      . TRAZODONE HCL 100 MG PO TABS Oral Take 100 mg by mouth at bedtime.        BP 179/100  Pulse 102  Temp 98.6 F (37 C) (Oral)  Resp 24  Ht 5\' 1"  (1.549 m)  Wt 125 lb (56.7 kg)  BMI 23.62 kg/m2  SpO2 97%  LMP 04/11/2012  Physical Exam  Nursing note and vitals reviewed. Constitutional: She is oriented to person, place, and time. She appears well-developed and well-nourished.  HENT:  Head: Atraumatic.  Eyes: Conjunctivae are normal. Pupils are equal, round, and reactive to light.  Neck: Normal range of motion. Neck supple.  Cardiovascular: Normal rate.   Pulmonary/Chest: Effort normal and breath sounds normal.  Abdominal: Soft. Bowel sounds are  normal.  Musculoskeletal:       Right ankle with patient with obvious deformity. Abrasion that ankle but fracture is closed. Dorsal pedalis pulses are 2+. Sensation is intact in toes. Refill is less than 2 seconds. Toes with good movement. There is no tenderness at the ankle or hip.  Neurological: She is alert and oriented to person, place, and time.  Skin: Skin is warm and dry.  Psychiatric: She has a normal mood and affect.    ED Course  ORTHOPEDIC INJURY TREATMENT Date/Time: 05/09/2012 10:07 PM Performed by: Hilario Quarry Authorized by: Hilario Quarry Consent: Written consent obtained. Risks and benefits: risks, benefits and alternatives were discussed Consent given by: patient Patient understanding: patient states understanding of the procedure being performed Patient consent: the patient's understanding of the procedure matches consent given Patient identity confirmed: verbally with patient and arm band Time out: Immediately prior to procedure a "time out" was called to verify the correct patient, procedure, equipment, support staff and  site/side marked as required. Injury location: ankle Location details: right ankle Injury type: fracture-dislocation Fracture type: trimalleolar Pre-procedure neurovascular assessment: neurovascularly intact Pre-procedure distal perfusion: normal Pre-procedure neurological function: normal Pre-procedure range of motion: reduced Local anesthesia used: no Patient sedated: yes Sedation type: moderate (conscious) sedation Sedatives: etomidate Analgesia: fentanyl Sedation start date/time: 05/09/2012 9:50 PM Sedation end date/time: 05/09/2012 10:08 PM Vitals: Vital signs were monitored during sedation. Manipulation performed: yes Immobilization: splint Splint type: short leg Supplies used: Ortho-Glass Post-procedure neurovascular assessment: post-procedure neurovascularly intact Post-procedure distal perfusion: normal Post-procedure neurological function: normal Post-procedure range of motion: unchanged Patient tolerance: Patient tolerated the procedure well with no immediate complications.   (including critical care time)  Labs Reviewed - No data to display Dg Ankle Right Port  05/09/2012  *RADIOLOGY REPORT*  Clinical Data: Fall down stairs  Three views right ankle, the patient was shielded  Comparison: None.  Findings: There is a fracture of the lateral malleolus, with distal fracture fragment  completely displaced from the shaft of the fibula in a lateral direction.  There is a fracture of the medial malleolus, which is severely displaced laterally and which is rotated.  The lateral image demonstrates fracture of the posterior malleolus, as well as anterior translocation of the tibia and fibula on the talus.  The mortise is significantly disrupted.  No fracture of the dome of the talus is identified.  IMPRESSION: Severely displaced trimalleolar fracture.   Original Report Authenticated By: Otilio Carpen, M.D.      No diagnosis found.    MDM  Patient's care discussed with Dr.  Romeo Apple and he will in the patient tonight for repair tomorrow.        Hilario Quarry, MD 05/09/12 2209  Hilario Quarry, MD 05/09/12 2217

## 2012-05-09 NOTE — ED Notes (Signed)
Pt being sent up at this time, Pt reports pain 8/10 right now. Pt is AAx4 and NAD noted. Strong pedal Pulses present bilaterally, vitals stable.

## 2012-05-09 NOTE — Progress Notes (Signed)
Patient ID: Brittany Beltran, female   DOB: 12/23/1980, 31 y.o.   MRN: [id number] xrays reviewed  I will assess the patient in the am   Regarding hypokalemia  Do not know when surgery will be keep npo

## 2012-05-10 ENCOUNTER — Inpatient Hospital Stay (HOSPITAL_COMMUNITY): Payer: Medicaid Other | Admitting: Anesthesiology

## 2012-05-10 ENCOUNTER — Inpatient Hospital Stay (HOSPITAL_COMMUNITY): Payer: Medicaid Other

## 2012-05-10 ENCOUNTER — Encounter (HOSPITAL_COMMUNITY): Payer: Self-pay | Admitting: Anesthesiology

## 2012-05-10 ENCOUNTER — Encounter (HOSPITAL_COMMUNITY)
Admission: EM | Disposition: A | Discharge status: Home / Self Care | Payer: Self-pay | Source: Home / Self Care | Attending: Orthopedic Surgery

## 2012-05-10 DIAGNOSIS — S82899A Other fracture of unspecified lower leg, initial encounter for closed fracture: Secondary | ICD-10-CM

## 2012-05-10 HISTORY — PX: ORIF ANKLE FRACTURE: SHX5408

## 2012-05-10 LAB — MRSA PCR SCREENING: MRSA by PCR: NEGATIVE

## 2012-05-10 LAB — SURGICAL PCR SCREEN: MRSA, PCR: NEGATIVE

## 2012-05-10 LAB — POTASSIUM: Potassium: 3.8 mEq/L (ref 3.5–5.1)

## 2012-05-10 SURGERY — OPEN REDUCTION INTERNAL FIXATION (ORIF) ANKLE FRACTURE
Anesthesia: General | Site: Ankle | Laterality: Right | Wound class: Clean

## 2012-05-10 MED ORDER — FENTANYL CITRATE 0.05 MG/ML IJ SOLN
INTRAMUSCULAR | Status: AC
  Filled 2012-05-10: qty 2

## 2012-05-10 MED ORDER — PROPOFOL 10 MG/ML IV EMUL
INTRAVENOUS | Status: DC | PRN
  Administered 2012-05-10: 50 mg via INTRAVENOUS
  Administered 2012-05-10: 150 mg via INTRAVENOUS
  Administered 2012-05-10: 50 mg via INTRAVENOUS

## 2012-05-10 MED ORDER — FENTANYL CITRATE 0.05 MG/ML IJ SOLN
INTRAMUSCULAR | Status: DC | PRN
  Administered 2012-05-10 (×2): 50 ug via INTRAVENOUS
  Administered 2012-05-10: 150 ug via INTRAVENOUS
  Administered 2012-05-10 (×4): 50 ug via INTRAVENOUS

## 2012-05-10 MED ORDER — OXYCODONE HCL 5 MG/5ML PO SOLN: 5.0000 mg | Freq: Once | ORAL | Status: AC | PRN

## 2012-05-10 MED ORDER — CHLORHEXIDINE GLUCONATE CLOTH 2 % EX PADS
6.0000 | MEDICATED_PAD | Freq: Every day | CUTANEOUS | Status: DC
  Administered 2012-05-11: 6 via TOPICAL

## 2012-05-10 MED ORDER — MENTHOL 3 MG MT LOZG: 1.0000 | LOZENGE | OROMUCOSAL | Status: DC | PRN

## 2012-05-10 MED ORDER — ONDANSETRON HCL 4 MG/2ML IJ SOLN: 4.0000 mg | Freq: Four times a day (QID) | INTRAMUSCULAR | Status: DC | PRN

## 2012-05-10 MED ORDER — MIRTAZAPINE 30 MG PO TABS
15.0000 mg | ORAL_TABLET | Freq: Every day | ORAL | Status: DC
  Administered 2012-05-10: 15 mg via ORAL
  Filled 2012-05-10: qty 1

## 2012-05-10 MED ORDER — FENTANYL CITRATE 0.05 MG/ML IJ SOLN
INTRAMUSCULAR | Status: AC
  Filled 2012-05-10: qty 5

## 2012-05-10 MED ORDER — MIDAZOLAM HCL 5 MG/5ML IJ SOLN
INTRAMUSCULAR | Status: DC | PRN
  Administered 2012-05-10: 2 mg via INTRAVENOUS

## 2012-05-10 MED ORDER — EPHEDRINE SULFATE 50 MG/ML IJ SOLN
INTRAMUSCULAR | Status: DC | PRN
  Administered 2012-05-10: 5 mg via INTRAVENOUS
  Administered 2012-05-10: 10 mg via INTRAVENOUS
  Administered 2012-05-10: 5 mg via INTRAVENOUS

## 2012-05-10 MED ORDER — PHENOL 1.4 % MT LIQD: 1.0000 | OROMUCOSAL | Status: DC | PRN

## 2012-05-10 MED ORDER — PAROXETINE HCL 20 MG PO TABS
30.0000 mg | ORAL_TABLET | Freq: Every day | ORAL | Status: DC
  Administered 2012-05-10: 30 mg via ORAL
  Filled 2012-05-10: qty 2

## 2012-05-10 MED ORDER — METHYLDOPA 250 MG PO TABS
ORAL_TABLET | ORAL | Status: AC
  Filled 2012-05-10: qty 1

## 2012-05-10 MED ORDER — KETOROLAC TROMETHAMINE 30 MG/ML IJ SOLN: 15.0000 mg | Freq: Once | INTRAMUSCULAR | Status: AC | PRN

## 2012-05-10 MED ORDER — FENTANYL CITRATE 0.05 MG/ML IJ SOLN
25.0000 ug | INTRAMUSCULAR | Status: DC | PRN
  Administered 2012-05-10 (×4): 50 ug via INTRAVENOUS

## 2012-05-10 MED ORDER — BUSPIRONE HCL 5 MG PO TABS
ORAL_TABLET | ORAL | Status: AC
  Filled 2012-05-10: qty 3

## 2012-05-10 MED ORDER — ENOXAPARIN SODIUM 30 MG/0.3ML ~~LOC~~ SOLN
30.0000 mg | SUBCUTANEOUS | Status: DC
  Administered 2012-05-11: 30 mg via SUBCUTANEOUS
  Filled 2012-05-10: qty 0.3

## 2012-05-10 MED ORDER — CEFAZOLIN SODIUM-DEXTROSE 2-3 GM-% IV SOLR
INTRAVENOUS | Status: AC
  Filled 2012-05-10: qty 150

## 2012-05-10 MED ORDER — METOCLOPRAMIDE HCL 10 MG PO TABS: 5.0000 mg | ORAL_TABLET | Freq: Three times a day (TID) | ORAL | Status: DC | PRN

## 2012-05-10 MED ORDER — SODIUM CHLORIDE 0.9 % IR SOLN
Status: DC | PRN
  Administered 2012-05-10: 1000 mL

## 2012-05-10 MED ORDER — KCL IN DEXTROSE-NACL 40-5-0.9 MEQ/L-%-% IV SOLN
INTRAVENOUS | Status: AC
  Filled 2012-05-10: qty 1000

## 2012-05-10 MED ORDER — SUCCINYLCHOLINE CHLORIDE 20 MG/ML IJ SOLN
INTRAMUSCULAR | Status: DC | PRN
  Administered 2012-05-10: 100 mg via INTRAVENOUS

## 2012-05-10 MED ORDER — ONDANSETRON HCL 4 MG PO TABS: 4.0000 mg | ORAL_TABLET | Freq: Four times a day (QID) | ORAL | Status: DC | PRN

## 2012-05-10 MED ORDER — MUPIROCIN 2 % EX OINT
1.0000 "application " | TOPICAL_OINTMENT | Freq: Two times a day (BID) | CUTANEOUS | Status: DC
  Administered 2012-05-10 – 2012-05-11 (×2): 1 via NASAL
  Filled 2012-05-10 (×2): qty 22

## 2012-05-10 MED ORDER — LACTATED RINGERS IV SOLN
INTRAVENOUS | Status: DC | PRN
  Administered 2012-05-10 (×2): via INTRAVENOUS

## 2012-05-10 MED ORDER — SENNA 8.6 MG PO TABS
1.0000 | ORAL_TABLET | Freq: Two times a day (BID) | ORAL | Status: DC
  Administered 2012-05-10 – 2012-05-11 (×2): 8.6 mg via ORAL
  Filled 2012-05-10 (×2): qty 1

## 2012-05-10 MED ORDER — CEFAZOLIN SODIUM 1-5 GM-% IV SOLN
INTRAVENOUS | Status: DC | PRN
  Administered 2012-05-10: 2 g via INTRAVENOUS

## 2012-05-10 MED ORDER — ONDANSETRON HCL 4 MG/2ML IJ SOLN
INTRAMUSCULAR | Status: DC | PRN
  Administered 2012-05-10: 4 mg via INTRAVENOUS

## 2012-05-10 MED ORDER — ONDANSETRON HCL 4 MG/2ML IJ SOLN
INTRAMUSCULAR | Status: AC
  Filled 2012-05-10: qty 2

## 2012-05-10 MED ORDER — MEPERIDINE HCL 50 MG/ML IJ SOLN: 6.2500 mg | INTRAMUSCULAR | Status: DC | PRN

## 2012-05-10 MED ORDER — EPHEDRINE SULFATE 50 MG/ML IJ SOLN
INTRAMUSCULAR | Status: AC
  Filled 2012-05-10: qty 1

## 2012-05-10 MED ORDER — BUPIVACAINE-EPINEPHRINE PF 0.5-1:200000 % IJ SOLN
INTRAMUSCULAR | Status: AC
  Filled 2012-05-10: qty 20

## 2012-05-10 MED ORDER — CEFAZOLIN SODIUM-DEXTROSE 2-3 GM-% IV SOLR
2.0000 g | Freq: Four times a day (QID) | INTRAVENOUS | Status: AC
  Administered 2012-05-10 – 2012-05-11 (×3): 2 g via INTRAVENOUS
  Filled 2012-05-10 (×2): qty 50

## 2012-05-10 MED ORDER — MIDAZOLAM HCL 2 MG/2ML IJ SOLN
INTRAMUSCULAR | Status: AC
Start: 2012-05-10 — End: 2012-05-10
  Filled 2012-05-10: qty 2

## 2012-05-10 MED ORDER — NABUMETONE 500 MG PO TABS
ORAL_TABLET | ORAL | Status: AC
  Filled 2012-05-10: qty 1

## 2012-05-10 MED ORDER — LIDOCAINE HCL (CARDIAC) 10 MG/ML IV SOLN
INTRAVENOUS | Status: DC | PRN
  Administered 2012-05-10: 50 mg via INTRAVENOUS

## 2012-05-10 MED ORDER — LIDOCAINE HCL (PF) 1 % IJ SOLN
INTRAMUSCULAR | Status: AC
  Filled 2012-05-10: qty 5

## 2012-05-10 MED ORDER — BISACODYL 10 MG RE SUPP: 10.0000 mg | Freq: Every day | RECTAL | Status: DC | PRN

## 2012-05-10 MED ORDER — CHLORHEXIDINE GLUCONATE 4 % EX LIQD
60.0000 mL | Freq: Once | CUTANEOUS | Status: AC
  Administered 2012-05-10: 4 via TOPICAL
  Filled 2012-05-10: qty 45
  Filled 2012-05-10: qty 15

## 2012-05-10 MED ORDER — OXYCODONE HCL 5 MG PO TABS: 5.0000 mg | ORAL_TABLET | Freq: Once | ORAL | Status: AC | PRN

## 2012-05-10 MED ORDER — MIRTAZAPINE 30 MG PO TABS
ORAL_TABLET | ORAL | Status: AC
  Filled 2012-05-10: qty 1

## 2012-05-10 MED ORDER — PROPOFOL 10 MG/ML IV EMUL
INTRAVENOUS | Status: AC
  Filled 2012-05-10: qty 20

## 2012-05-10 MED ORDER — METOCLOPRAMIDE HCL 5 MG/ML IJ SOLN: 5.0000 mg | Freq: Three times a day (TID) | INTRAMUSCULAR | Status: DC | PRN

## 2012-05-10 MED ORDER — ZOLPIDEM TARTRATE 5 MG PO TABS
10.0000 mg | ORAL_TABLET | Freq: Every day | ORAL | Status: DC
  Administered 2012-05-10: 10 mg via ORAL
  Filled 2012-05-10: qty 2

## 2012-05-10 MED ORDER — ARTIFICIAL TEARS OP OINT
TOPICAL_OINTMENT | OPHTHALMIC | Status: AC
  Filled 2012-05-10: qty 3.5

## 2012-05-10 MED ORDER — SODIUM CHLORIDE 0.9 % IV SOLN
INTRAVENOUS | Status: DC
  Administered 2012-05-10: 1000 mL via INTRAVENOUS

## 2012-05-10 MED ORDER — ALUM & MAG HYDROXIDE-SIMETH 200-200-20 MG/5ML PO SUSP: 30.0000 mL | ORAL | Status: DC | PRN

## 2012-05-10 MED ORDER — SODIUM CHLORIDE 0.9 % IJ SOLN
INTRAMUSCULAR | Status: AC
  Administered 2012-05-10: 10 mL
  Filled 2012-05-10: qty 3

## 2012-05-10 MED ORDER — CEFAZOLIN SODIUM-DEXTROSE 2-3 GM-% IV SOLR
2.0000 g | INTRAVENOUS | Status: AC
  Administered 2012-05-10: 2 g via INTRAVENOUS
  Filled 2012-05-10: qty 50

## 2012-05-10 MED ORDER — SUCCINYLCHOLINE CHLORIDE 20 MG/ML IJ SOLN
INTRAMUSCULAR | Status: AC
  Filled 2012-05-10: qty 1

## 2012-05-10 MED ORDER — BUPIVACAINE-EPINEPHRINE PF 0.5-1:200000 % IJ SOLN
INTRAMUSCULAR | Status: DC | PRN
  Administered 2012-05-10: 60 mL

## 2012-05-10 SURGICAL SUPPLY — 73 items
1.4 k-wire ×2 IMPLANT
BAG HAMPER (MISCELLANEOUS) ×2 IMPLANT
BANDAGE ELASTIC 3 VELCRO NS (GAUZE/BANDAGES/DRESSINGS) ×1 IMPLANT
BANDAGE ELASTIC 4 VELCRO NS (GAUZE/BANDAGES/DRESSINGS) ×3 IMPLANT
BANDAGE ELASTIC 6 VELCRO NS (GAUZE/BANDAGES/DRESSINGS) ×3 IMPLANT
BANDAGE ESMARK 4X12 BL STRL LF (DISPOSABLE) ×1 IMPLANT
BIT DRILL 2.5X110 QC LCP DISP (BIT) ×1 IMPLANT
BIT DRILL 2.5X125 (BIT) ×1 IMPLANT
BIT DRILL 2.8 (BIT)
BIT DRILL CANN QC 2.8X165 (BIT) ×1 IMPLANT
BIT DRILL QC 3.5X110 (BIT) IMPLANT
BLADE SURG 15 STRL LF DISP TIS (BLADE) IMPLANT
BLADE SURG 15 STRL SS (BLADE) ×2
BLADE SURG SZ10 CARB STEEL (BLADE) ×2 IMPLANT
BNDG CMPR 12X4 ELC STRL LF (DISPOSABLE) ×1
BNDG COHESIVE 4X5 TAN STRL (GAUZE/BANDAGES/DRESSINGS) ×2 IMPLANT
BNDG ESMARK 4X12 BLUE STRL LF (DISPOSABLE) ×2
CHLORAPREP W/TINT 26ML (MISCELLANEOUS) ×2 IMPLANT
CLOTH BEACON ORANGE TIMEOUT ST (SAFETY) ×2 IMPLANT
COVER LIGHT HANDLE STERIS (MISCELLANEOUS) ×4 IMPLANT
CUFF TOURNIQUET SINGLE 34IN LL (TOURNIQUET CUFF) ×2 IMPLANT
DRAPE C-ARM FOLDED MOBILE STRL (DRAPES) ×2 IMPLANT
DRAPE PROXIMA HALF (DRAPES) ×2 IMPLANT
DRILL BIT 2.8MM (BIT)
GAUZE XEROFORM 5X9 LF (GAUZE/BANDAGES/DRESSINGS) ×2 IMPLANT
GLOVE INDICATOR 7.0 STRL GRN (GLOVE) ×3 IMPLANT
GLOVE INDICATOR 7.5 STRL GRN (GLOVE) ×1 IMPLANT
GLOVE SKINSENSE NS SZ8.0 LF (GLOVE) ×1
GLOVE SKINSENSE STRL SZ8.0 LF (GLOVE) ×1 IMPLANT
GLOVE SS N UNI LF 7.0 STRL (GLOVE) ×1 IMPLANT
GLOVE SS N UNI LF 8.5 STRL (GLOVE) ×2 IMPLANT
GOWN STRL REIN XL XLG (GOWN DISPOSABLE) ×5 IMPLANT
INST SET MINOR BONE (KITS) ×2 IMPLANT
K-WIRE 1.25 TRCR POINT 150 (WIRE)
K-WIRE 1.6X150 (WIRE)
K-WIRE 2.0X150M (WIRE)
K-WIRE ORTHOPEDIC 1.4X150L (WIRE) ×4
K-WIRE STRYKER (Wire) ×2 IMPLANT
KIT ROOM TURNOVER APOR (KITS) ×2 IMPLANT
KWIRE 1.25 TRCR POINT 150 (WIRE) IMPLANT
KWIRE 1.6X150 (WIRE) IMPLANT
KWIRE 2.0X150M (WIRE) IMPLANT
KWIRE ORTHOPEDIC 1.4X150L (WIRE) IMPLANT
KWIRE STRYKER (Wire) IMPLANT
MANIFOLD NEPTUNE II (INSTRUMENTS) ×2 IMPLANT
NDL HYPO 21X1.5 SAFETY (NEEDLE) ×1 IMPLANT
NEEDLE HYPO 21X1.5 SAFETY (NEEDLE) ×2 IMPLANT
NS IRRIG 1000ML POUR BTL (IV SOLUTION) ×2 IMPLANT
PACK BASIC LIMB (CUSTOM PROCEDURE TRAY) ×2 IMPLANT
PAD ABD 5X9 TENDERSORB (GAUZE/BANDAGES/DRESSINGS) ×4 IMPLANT
PAD ARMBOARD 7.5X6 YLW CONV (MISCELLANEOUS) ×2 IMPLANT
PAD CAST 4YDX4 CTTN HI CHSV (CAST SUPPLIES) ×1 IMPLANT
PADDING CAST COTTON 4X4 STRL (CAST SUPPLIES) ×4
PADDING CAST COTTON 6X4 STRL (CAST SUPPLIES) ×1 IMPLANT
PLATE DISTAL FIBULA 3HOLE (Plate) ×1 IMPLANT
SCREW 3.5X10MM (Screw) ×2 IMPLANT
SCREW BONE 14MMX3.5MM (Screw) ×1 IMPLANT
SCREW BONE 18 (Screw) ×2 IMPLANT
SCREW BONE 3.5X16MM (Screw) ×1 IMPLANT
SCREW BONE NON-LCKING 3.5X12MM (Screw) ×1 IMPLANT
SET BASIN LINEN APH (SET/KITS/TRAYS/PACK) ×2 IMPLANT
SPLINT IMMOBILIZER J 3INX20FT (CAST SUPPLIES) ×1
SPLINT J IMMOBILIZER 3X20FT (CAST SUPPLIES) IMPLANT
SPLINT J IMMOBILIZER 4X20FT (CAST SUPPLIES) ×1 IMPLANT
SPLINT J PLASTER J 4INX20Y (CAST SUPPLIES)
SPONGE GAUZE 4X4 12PLY (GAUZE/BANDAGES/DRESSINGS) ×2 IMPLANT
SPONGE LAP 18X18 X RAY DECT (DISPOSABLE) ×2 IMPLANT
STAPLER VISISTAT 35W (STAPLE) ×2 IMPLANT
SUT ETHILON 3 0 FSL (SUTURE) IMPLANT
SUT MON AB 0 CT1 (SUTURE) ×2 IMPLANT
SUT MON AB 2-0 CT1 36 (SUTURE) ×1 IMPLANT
SYR 30ML LL (SYRINGE) ×2 IMPLANT
SYR BULB IRRIGATION 50ML (SYRINGE) ×2 IMPLANT

## 2012-05-10 NOTE — H&P (Signed)
Brittany Beltran is an 31 y.o. female.   Chief Complaint: Pain right ankle HPI: 31 year old female with a history of postpartum depression recurrent behavioral health admissions last year presents with a history of hypertension anxiety depression postpartum history of breast abscess and I&D for such who was walking up some steps yesterday and twisted her ankle sustaining a closed trimalleolar fracture of the right ankle. She presented to the emergency room with a fracture dislocation which was closed reduced by the emergency room physician. Postreduction films showed reestablishment of the tail is in tibia with continued displacement of the medial malleolar fragment. As this is an unstable fracture will require open treatment internal fixation. The patient also had hypokalemia which was corrected with oral and IV potassium.  The presence of the patient's husband I discussed the treatment options which really well down to surgical treatment. Nonsurgical treatment will not give a good chance for functional ankle as this fracture is unstable.  The initial pain was severe it was nonradiating confined to the ankle medially and laterally with deformity this has been improved with oral and IV pain medication splinting reduction ice and elevation.  Past Medical History  Diagnosis Date  . Hypertension   . Anxiety   . Depression     post partum  . Allergy   . Breast abscess   . Mastitis     Past Surgical History  Procedure Date  . Incise and drain abcess   . Mouth surgery   . Incision and drainage breast abscess     Family History  Problem Relation Age of Onset  . Cancer Mother     breast  . Alcohol abuse Sister    Social History:  reports that she has never smoked. She has never used smokeless tobacco. She reports that she does not drink alcohol or use illicit drugs.  Allergies:  Allergies  Allergen Reactions  . Latex   . Promethazine Hcl     REACTION: psych effects  . Vancomycin    Redness, swelling, and itching     Medications Prior to Admission  Medication Sig Dispense Refill  . busPIRone (BUSPAR) 15 MG tablet Take 15 mg by mouth 2 (two) times daily.       . mirtazapine (REMERON) 15 MG tablet Take 15 mg by mouth at bedtime.      . Multiple Vitamin (MULTIVITAMIN) tablet Take 1 tablet by mouth daily.      Marland Kitchen PARoxetine (PAXIL) 30 MG tablet Take 30 mg by mouth at bedtime.      Marland Kitchen zolpidem (AMBIEN) 10 MG tablet Take 10 mg by mouth at bedtime.      Marland Kitchen ibuprofen (ADVIL,MOTRIN) 800 MG tablet Take 800 mg by mouth every 8 (eight) hours as needed. pain      . IRON PO Take 1 tablet by mouth daily.        . L-methylfolate Calcium 15 MG TABS Take by mouth daily.        Marland Kitchen NIFEdipine (PROCARDIA XL/ADALAT-CC) 90 MG 24 hr tablet Take 1 tablet (90 mg total) by mouth daily.  30 tablet  3  . Norethindrone-Ethinyl Estradiol-Fe (GENERESS FE) 0.8-25 MG-MCG tablet Chew 1 tablet by mouth daily.  1 Package  11  . Probiotic Product (PROBIOTIC PO) Take 1 tablet by mouth daily.        . sertraline (ZOLOFT) 100 MG tablet Take 100 mg by mouth daily.        . traZODone (DESYREL) 100 MG tablet Take 100  mg by mouth at bedtime.          Results for orders placed during the hospital encounter of 05/09/12 (from the past 48 hour(s))  CBC WITH DIFFERENTIAL     Status: Normal   Collection Time   05/09/12 10:14 PM      Component Value Range Comment   WBC 9.6  4.0 - 10.5 K/uL    RBC 4.08  3.87 - 5.11 MIL/uL    Hemoglobin 12.6  12.0 - 15.0 g/dL    HCT 82.9  56.2 - 13.0 %    MCV 90.0  78.0 - 100.0 fL    MCH 30.9  26.0 - 34.0 pg    MCHC 34.3  30.0 - 36.0 g/dL    RDW 86.5  78.4 - 69.6 %    Platelets 333  150 - 400 K/uL    Neutrophils Relative 67  43 - 77 %    Neutro Abs 6.4  1.7 - 7.7 K/uL    Lymphocytes Relative 25  12 - 46 %    Lymphs Abs 2.4  0.7 - 4.0 K/uL    Monocytes Relative 6  3 - 12 %    Monocytes Absolute 0.6  0.1 - 1.0 K/uL    Eosinophils Relative 2  0 - 5 %    Eosinophils Absolute 0.2   0.0 - 0.7 K/uL    Basophils Relative 0  0 - 1 %    Basophils Absolute 0.0  0.0 - 0.1 K/uL   URINALYSIS, ROUTINE W REFLEX MICROSCOPIC     Status: Abnormal   Collection Time   05/09/12 11:26 PM      Component Value Range Comment   Color, Urine YELLOW  YELLOW    APPearance CLEAR  CLEAR    Specific Gravity, Urine 1.020  1.005 - 1.030    pH 6.0  5.0 - 8.0    Glucose, UA NEGATIVE  NEGATIVE mg/dL    Hgb urine dipstick MODERATE (*) NEGATIVE    Bilirubin Urine NEGATIVE  NEGATIVE    Ketones, ur NEGATIVE  NEGATIVE mg/dL    Protein, ur NEGATIVE  NEGATIVE mg/dL    Urobilinogen, UA 0.2  0.0 - 1.0 mg/dL    Nitrite NEGATIVE  NEGATIVE    Leukocytes, UA NEGATIVE  NEGATIVE   PREGNANCY, URINE     Status: Normal   Collection Time   05/09/12 11:26 PM      Component Value Range Comment   Preg Test, Ur NEGATIVE  NEGATIVE   URINE MICROSCOPIC-ADD ON     Status: Normal   Collection Time   05/09/12 11:26 PM      Component Value Range Comment   Squamous Epithelial / LPF RARE  RARE    WBC, UA 0-2  <3 WBC/hpf    RBC / HPF 7-10  <3 RBC/hpf    Bacteria, UA RARE  RARE   MRSA PCR SCREENING     Status: Normal   Collection Time   05/09/12 11:49 PM      Component Value Range Comment   MRSA by PCR NEGATIVE  NEGATIVE   POTASSIUM     Status: Normal   Collection Time   05/10/12  5:00 AM      Component Value Range Comment   Potassium 3.8  3.5 - 5.1 mEq/L    Dg Ankle Right Port  05/09/2012  *RADIOLOGY REPORT*  Clinical Data: 31 year old female status post fall.  Fracture. Post reduction.  PORTABLE RIGHT ANKLE - 2 VIEW  Comparison:  2137 hours the same day.  Findings: Casting material now about the right ankle.  Interval improved alignment of the mortise joint.  Residual lateral subluxation.  Significantly improved alignment of all malleolar fragments.  Calcaneus and talar dome appear intact.  IMPRESSION: Significantly improved alignment status post casting of right ankle trimalleolar fracture.   Original Report  Authenticated By: Harley Hallmark, M.D.    Dg Ankle Right Port  05/09/2012  *RADIOLOGY REPORT*  Clinical Data: Fall down stairs  Three views right ankle, the patient was shielded  Comparison: None.  Findings: There is a fracture of the lateral malleolus, with distal fracture fragment  completely displaced from the shaft of the fibula in a lateral direction.  There is a fracture of the medial malleolus, which is severely displaced laterally and which is rotated.  The lateral image demonstrates fracture of the posterior malleolus, as well as anterior translocation of the tibia and fibula on the talus.  The mortise is significantly disrupted.  No fracture of the dome of the talus is identified.  IMPRESSION: Severely displaced trimalleolar fracture.   Original Report Authenticated By: Otilio Carpen, M.D.     Review of Systems  Constitutional: Negative for fever.  Eyes: Negative for blurred vision.  Respiratory: Negative for shortness of breath.   Cardiovascular: Negative for chest pain.  Gastrointestinal: Negative for heartburn.  Genitourinary: Negative for dysuria.  Musculoskeletal: Negative for myalgias.  Skin: Negative for rash.  Neurological: Negative for dizziness and headaches.  Endo/Heme/Allergies: Does not bruise/bleed easily.  Psychiatric/Behavioral: Negative for depression.    Blood pressure 133/90, pulse 86, temperature 98.1 F (36.7 C), temperature source Oral, resp. rate 20, height 5\' 1"  (1.549 m), weight 60.646 kg (133 lb 11.2 oz), last menstrual period 04/11/2012, SpO2 98.00%. Physical Exam  Constitutional: She is oriented to person, place, and time. She appears well-developed and well-nourished. No distress.  HENT:  Head: Normocephalic.  Eyes: Pupils are equal, round, and reactive to light.  Neck: Normal range of motion.  Cardiovascular: Normal rate.   Respiratory: Effort normal.  GI: She exhibits no distension.  Musculoskeletal:       Upper extremity exam  Inspection  and palpation revealed no abnormalities in the upper extremities.  Range of motion is full without contracture.  Motor exam is normal with grade 5 strength.  The joints are fully reduced without subluxation.  There is no atrophy or tremor and muscle tone is normal.  All joints are stable.      Left Lower extremity exam  Ambulation is secondary to the right ankle fracture. The patient cannot ambulate on the right leg.  Inspection and palpation revealed no tenderness or abnormality in alignment in the lower extremities. Range of motion is full.  Strength is grade 5.   all joints are stable.   Ankle is in a splint it has been reduced the skin looks good. Dorsal sensation over the foot is normal. Color is good. Capillary refill good.  Muscle tone is good.     Neurological: She is alert and oriented to person, place, and time. She exhibits normal muscle tone.  Skin: Skin is warm and dry. She is not diaphoretic.  Psychiatric: She has a normal mood and affect. Her behavior is normal. Judgment and thought content normal.     Assessment/Plan Closed right trimalleolar ankle fracture  Open treatment internal fixation right ankle.    Fuller Canada 05/10/2012, 7:38 AM

## 2012-05-10 NOTE — Brief Op Note (Signed)
05/09/2012 - 05/10/2012  3:49 PM  PATIENT:  Brittany Beltran  31 y.o. female  PRE-OPERATIVE DIAGNOSIS:  trimalleolar right ankle fracture, closed  POST-OPERATIVE DIAGNOSIS:  trimalleolar right ankle fracture, closed  PROCEDURE:  Procedure(s) (LRB): OPEN REDUCTION INTERNAL FIXATION (ORIF) ANKLE FRACTURE (Right)  Implants stryker variax lateral plate with 2 medial K - wires   SURGEON:  Surgeon(s) and Role:    * Vickki Hearing, MD - Primary  PHYSICIAN ASSISTANT:   ASSISTANTS: none   ANESTHESIA:   general  EBL:  Total I/O In: 400 [I.V.:400] Out: 1020 [Urine:1000; Blood:20]  BLOOD ADMINISTERED:none  DRAINS: none   LOCAL MEDICATIONS USED:  MARCAINE   , Amount: 60 ml and OTHER epi  SPECIMEN:  No Specimen  DISPOSITION OF SPECIMEN:  N/A  COUNTS:  YES  TOURNIQUET:   Total Tourniquet Time Documented: Thigh (Right) - 63 minutes  DICTATION: .Reubin [place] Dictation  PLAN OF CARE: Admit to inpatient   PATIENT DISPOSITION:  PACU - hemodynamically stable.   Delay start of Pharmacological VTE agent (>24hrs) due to surgical blood loss or risk of bleeding: no

## 2012-05-10 NOTE — Addendum Note (Signed)
Addendum  created 05/10/12 1633 by Marolyn Hammock, CRNA   Modules edited:Charges VN

## 2012-05-10 NOTE — Anesthesia Preprocedure Evaluation (Addendum)
Anesthesia Evaluation  Patient identified by MRN, date of birth, ID band Patient awake    Reviewed: Allergy & Precautions, H&P , NPO status , Patient's Chart, lab work & pertinent test results, reviewed documented beta blocker date and time   History of Anesthesia Complications (+) AWARENESS UNDER ANESTHESIA  Airway Mallampati: I TM Distance: >3 FB Neck ROM: Full    Dental No notable dental hx. (+) Teeth Intact   Pulmonary  breath sounds clear to auscultation  Pulmonary exam normal       Cardiovascular hypertension, Pt. on medications Rhythm:Regular     Neuro/Psych Anxiety Depression    GI/Hepatic   Endo/Other    Renal/GU      Musculoskeletal   Abdominal Normal abdominal exam  (+)   Peds  Hematology   Anesthesia Other Findings   Reproductive/Obstetrics                         Anesthesia Physical Anesthesia Plan  ASA: I  Anesthesia Plan: General   Post-op Pain Management:    Induction: Intravenous and Cricoid pressure planned  Airway Management Planned: Oral ETT  Additional Equipment:   Intra-op Plan:   Post-operative Plan: Extubation in OR  Informed Consent: I have reviewed the patients History and Physical, chart, labs and discussed the procedure including the risks, benefits and alternatives for the proposed anesthesia with the patient or authorized representative who has indicated his/her understanding and acceptance.   Dental advisory given  Plan Discussed with: Anesthesiologist  Anesthesia Plan Comments:        Anesthesia Quick Evaluation

## 2012-05-10 NOTE — Op Note (Signed)
Date of surgery 05/10/2012  Date of injury 05/09/2012  Diagnosis closed right trimalleolar ankle fracture  Postoperative diagnosis same  Procedure open treatment internal fixation right ankle. Implants Stryker VARIAX ankle fixation system  Tourniquet time 63 minutes  Anesthesia Gen.  Operative findings displaced lateral malleolus, displaced medial malleolus, posterior malleolar fragment less than 25% of joint surface  The patient was identified in the preoperative holding area the surgical site was confirmed and marked chart review was completed  The patient was taken to the operating room she was given 2 g of Ancef intravenously. General anesthesia was administered smoothly. A tourniquet was applied to the right thigh. 2 sandbag were placed under the right hip. The limb was then prepped and draped sterilely.  The timeout procedure was executed properly.  The limb was exsanguinated with a 4 inch Esmarch. The tourniquet was elevated to 300 mm of mercury. A lateral incision was made slightly anteriorly over the fibula. Incision was taken down to bone to create a full-thickness skin flap. The fracture site was identified irrigated debrided and then manually reduced and held with a bone clamp, followed by confirmation with x-ray  A VARIAX plate was then applied using 3.5-hybrid screws. After the first 2 screws were placed radiographs confirmed reduction of the mortise. The remaining screws were placed. Final x-ray of the lateral malleolus showed reduction of the fracture and the mortise to be reduced as well as the posterior malleolar fragment reduction and medial malleolar fragment reduction  We turned our attention to the medial side and made a straight incision over the fracture to get down to bone and down to the deltoid ligament. 2 K wires were placed across the fracture site and were confirmed to be in excellent position with x-ray. The pins were backed out bent cut and then impacted into  the bone. The edges of the pins were confirmed to be buried in the bone.  The wounds were irrigated. Radiographs confirmed reduction including lateral x-rays.  The medial side was closed with 2-0 Monocryl and staples. A lateral side was closed with 0 Monocryl and staples. 50 cc of Marcaine with epinephrine  Was injected around the wounds including a block of the superficial peroneal nerve and the sural nerve  Sterile dressings were applied along with a posterior splint with the foot in neutral position. The tourniquet was released prior to placing the splint. The splint was well-padded.  The patient was extubated and taken to the recovery room in stable condition  We can initiate Lovenox.  Physical therapy consult in the morning for gait training and plan for discharge tomorrow afternoon

## 2012-05-10 NOTE — Progress Notes (Signed)
Awake. Continues talking. Rates pain 7. States "Pain is not that bad. I can tolerate it." Ready to go to room. Wants to see husband.

## 2012-05-10 NOTE — Transfer of Care (Signed)
Immediate Anesthesia Transfer of Care Note  Patient: Brittany Beltran  Procedure(s) Performed: Procedure(s) (LRB): OPEN REDUCTION INTERNAL FIXATION (ORIF) ANKLE FRACTURE (Right)  Patient Location: PACU  Anesthesia Type: General  Level of Consciousness: awake, alert , oriented and patient cooperative  Airway & Oxygen Therapy: Patient Spontanous Breathing and Patient connected to face mask oxygen  Post-op Assessment: Report given to PACU RN and Post -op Vital signs reviewed and stable  Post vital signs: Reviewed and stable  Complications: No apparent anesthesia complications

## 2012-05-10 NOTE — Progress Notes (Signed)
Awake. Rt leg elevated on pillows. Ice bag to each side of ankle. Rt toes pink in color/warm to touch. Moves rt toes without difficulty.

## 2012-05-10 NOTE — Anesthesia Postprocedure Evaluation (Signed)
  Anesthesia Post-op Note  Patient: Brittany Beltran  Procedure(s) Performed: Procedure(s) (LRB): OPEN REDUCTION INTERNAL FIXATION (ORIF) ANKLE FRACTURE (Right)  Patient Location: PACU  Anesthesia Type: General  Level of Consciousness: awake, alert , oriented and patient cooperative  Airway and Oxygen Therapy: Patient Spontanous Breathing and Patient connected to face mask oxygen  Post-op Pain: mild  Post-op Assessment: Post-op Vital signs reviewed, Patient's Cardiovascular Status Stable, Respiratory Function Stable, Patent Airway, No signs of Nausea or vomiting and Pain level controlled  Post-op Vital Signs: Reviewed and stable  Complications: No apparent anesthesia complications

## 2012-05-10 NOTE — Anesthesia Procedure Notes (Signed)
Procedure Name: Intubation Date/Time: 05/10/2012 2:22 PM Performed by: Carolyne Littles, AMY L Pre-anesthesia Checklist: Patient identified, Patient being monitored, Timeout performed, Emergency Drugs available and Suction available Patient Re-evaluated:Patient Re-evaluated prior to inductionOxygen Delivery Method: Circle System Utilized Preoxygenation: Pre-oxygenation with 100% oxygen Intubation Type: IV induction and Cricoid Pressure applied Ventilation: Mask ventilation without difficulty Laryngoscope Size: 3 and Miller Grade View: Grade I Tube type: Oral Tube size: 7.0 mm Number of attempts: 1 Airway Equipment and Method: stylet Placement Confirmation: ETT inserted through vocal cords under direct vision,  positive ETCO2 and breath sounds checked- equal and bilateral Secured at: 21 cm Tube secured with: Tape Dental Injury: Teeth and Oropharynx as per pre-operative assessment

## 2012-05-11 MED ORDER — IBUPROFEN 800 MG PO TABS
800.0000 mg | ORAL_TABLET | Freq: Three times a day (TID) | ORAL | Status: DC
  Administered 2012-05-11 (×2): 800 mg via ORAL
  Filled 2012-05-11 (×2): qty 1

## 2012-05-11 MED ORDER — IBUPROFEN 800 MG PO TABS: 800.0000 mg | ORAL_TABLET | Freq: Three times a day (TID) | ORAL | Status: AC

## 2012-05-11 MED ORDER — OXYCODONE-ACETAMINOPHEN 5-325 MG PO TABS: 1.0000 | ORAL_TABLET | ORAL | Status: AC

## 2012-05-11 NOTE — Clinical Social Work Note (Signed)
CSW received consult for placement. Pt plans to return home at d/c. No PT follow up. CM aware and following. CSW will sign off unless further needs arise.  Derenda Fennel, [place] [phone]

## 2012-05-11 NOTE — Plan of Care (Signed)
Problem: Phase I Progression Outcomes Goal: Initial discharge plan identified Outcome: Completed/Met Date Met:  05/11/12 Plan to go home with walker brought in from home.  Case Management is seeing the patient.

## 2012-05-11 NOTE — Progress Notes (Signed)
Subjective: 1 Day Post-Op Procedure(s) (LRB): OPEN REDUCTION INTERNAL FIXATION (ORIF) ANKLE FRACTURE (Right) Patient reports pain as severe.  throbbing  Objective: Vital signs in last 24 hours: Temp:  [97.8 F (36.6 C)-99.3 F (37.4 C)] 97.8 F (36.6 C) (08/26 0404) Pulse Rate:  [89-121] 92  (08/26 0404) Resp:  [10-18] 18  (08/26 0404) BP: (115-139)/(78-95) 115/81 mmHg (08/26 0404) SpO2:  [93 %-100 %] 97 % (08/26 0404)  Intake/Output from previous day: 08/25 0701 - 08/26 0700 In: 2261.7 [P.O.:480; I.V.:1581.7; IV Piggyback:200] Out: 2970 [Urine:2950; Blood:20] Intake/Output this shift:     Basename 05/09/12 2214  HGB 12.6    Basename 05/09/12 2214  WBC 9.6  RBC 4.08  HCT 36.7  PLT 333    Basename 05/10/12 0500  NA --  K 3.8  CL --  CO2 --  BUN --  CREATININE --  GLUCOSE --  CALCIUM --   No results found for this basename: LABPT:2,INR:2 in the last 72 hours  Neurologically intact Neurovascular intact Sensation intact distally  Assessment/Plan: 1 Day Post-Op Procedure(s) (LRB): OPEN REDUCTION INTERNAL FIXATION (ORIF) ANKLE FRACTURE (Right) Advance diet Up with therapy D/C IV fluids PT this afternoon  Assess discharge after that   Fuller Canada 05/11/2012, 7:52 AM

## 2012-05-11 NOTE — Care Management Note (Unsigned)
    Page 1 of 1   05/11/2012     11:26:11 AM   CARE MANAGEMENT NOTE 05/11/2012  Patient:  Brittany Beltran, Brittany Beltran   Account Number:  [id number]  Date Initiated:  05/11/2012  Documentation initiated by:  Sharrie Rothman  Subjective/Objective Assessment:   Pt admitted from home with fractured ankle. Pt lives with husband and will return home at discharge. Pt is confident that she will be able to get a walker from a family member. Pt is very concerned about hospital bill and [facility] cost.     Action/Plan:   PT does not recommend any HH at discharge. CM will order walker if pt cannot get one from family. Financial counselor referral made for hospital bill concerns.   Anticipated DC Date:  05/11/2012   Anticipated DC Plan:  HOME/SELF CARE  In-house referral  Financial Counselor      DC Planning Services  CM consult      Choice offered to / List presented to:             Status of service:  In process, will continue to follow Medicare Important Message given?   (If response is "NO", the following Medicare IM given date fields will be blank) Date Medicare IM given:   Date Additional Medicare IM given:    Discharge Disposition:    Per UR Regulation:    If discussed at Long Length of Stay Meetings, dates discussed:    Comments:  05/11/12 1125 Arlyss Queen, RN BSN CM

## 2012-05-11 NOTE — Addendum Note (Signed)
Addendum  created 05/11/12 1022 by Moshe Salisbury, CRNA   Modules edited:Notes Section

## 2012-05-11 NOTE — Anesthesia Postprocedure Evaluation (Signed)
  Anesthesia Post-op Note  Patient: Brittany Beltran  Procedure(s) Performed: Procedure(s) (LRB): OPEN REDUCTION INTERNAL FIXATION (ORIF) ANKLE FRACTURE (Right)  Patient Location: PACU  Anesthesia Type: General  Level of Consciousness: awake, alert , oriented and patient cooperative  Airway and Oxygen Therapy: Patient Spontanous Breathing  Post-op Pain: 5 /10, moderate  Post-op Assessment: Patient's Cardiovascular Status Stable, Respiratory Function Stable, Patent Airway and No signs of Nausea or vomiting  Post-op Vital Signs: Reviewed and stable  Complications: No apparent anesthesia complications

## 2012-05-11 NOTE — Plan of Care (Signed)
Problem: Phase I Progression Outcomes Goal: OOB as tolerated unless otherwise ordered Outcome: Progressing OOB today for gait training with the therapist.

## 2012-05-11 NOTE — Progress Notes (Signed)
UR Chart Review Completed  

## 2012-05-11 NOTE — Discharge Summary (Signed)
Physician Discharge Summary  Patient ID: JARIANA SHUMARD MRN: [id number] DOB/AGE: 1981-02-08 30 y.o.  Admit date: 05/09/2012 Discharge date: 05/11/2012  Admission Diagnoses: left ankle trimalleolar fracture  Discharge Diagnoses: same Active Problems:  Fracture of ankle, trimalleolar, right, closed   Discharged Condition: good  Hospital Course: unremarkable  Consults: None  Significant Diagnostic Studies: none  Treatments: surgery: OTIF right ankle for trimalleolar ankle fracture, stryker VARIAX on 05/10/2012  Discharge Exam: Blood pressure 124/81, pulse 120, temperature 98.3 F (36.8 C), temperature source Oral, resp. rate 16, height 5\' 1"  (1.549 m), weight 60.646 kg (133 lb 11.2 oz), last menstrual period 04/11/2012, SpO2 94.00%. normal exam   Disposition: 01-Home or Self Care  Discharge Orders    Future Orders Please Complete By Expires   Diet - low sodium heart healthy      Increase activity slowly      Discharge instructions      Comments:   Keep clean keep dry   Do not get cast wet  When sitting apply ice and elevate the foot   Take the ibuprofen when you fell throbbing   Call MD for:  persistant nausea and vomiting      Call MD for:  temperature >100.4      Call MD for:  severe uncontrolled pain        Medication List  As of 05/11/2012  8:56 PM   STOP taking these medications         IRON PO      L-methylfolate Calcium 15 MG Tabs      PROBIOTIC PO         TAKE these medications         BUSPAR 15 MG tablet   Generic drug: busPIRone   Take 15 mg by mouth 2 (two) times daily.      ibuprofen 800 MG tablet   Commonly known as: ADVIL,MOTRIN   Take 1 tablet (800 mg total) by mouth 4 (four) times daily -  with meals and at bedtime.      mirtazapine 15 MG tablet   Commonly known as: REMERON   Take 15 mg by mouth at bedtime.      multivitamin tablet   Take 1 tablet by mouth daily.      oxyCODONE-acetaminophen 5-325 MG per tablet   Commonly  known as: PERCOCET/ROXICET   Take 1 tablet by mouth every 4 (four) hours.      PARoxetine 30 MG tablet   Commonly known as: PAXIL   Take 30 mg by mouth at bedtime.      sertraline 100 MG tablet   Commonly known as: ZOLOFT   Take 100 mg by mouth daily.      traZODone 100 MG tablet   Commonly known as: DESYREL   Take 100 mg by mouth at bedtime.      zolpidem 10 MG tablet   Commonly known as: AMBIEN   Take 10 mg by mouth at bedtime.           Follow-up Information    Follow up with Fuller Canada, MD. Schedule an appointment as soon as possible for a visit on 05/19/2012.   Contact information:   [address] [place] [address] [phone]          Signed: Keerat Denicola 05/11/2012, 8:56 PM

## 2012-05-19 ENCOUNTER — Ambulatory Visit (INDEPENDENT_AMBULATORY_CARE_PROVIDER_SITE_OTHER): Payer: Self-pay | Admitting: Orthopedic Surgery

## 2012-05-19 ENCOUNTER — Encounter: Payer: Self-pay | Admitting: Orthopedic Surgery

## 2012-05-19 VITALS — BP 150/90 | Ht 61.0 in | Wt 133.0 lb

## 2012-05-19 DIAGNOSIS — S82853A Displaced trimalleolar fracture of unspecified lower leg, initial encounter for closed fracture: Secondary | ICD-10-CM

## 2012-05-19 DIAGNOSIS — S82851A Displaced trimalleolar fracture of right lower leg, initial encounter for closed fracture: Secondary | ICD-10-CM

## 2012-05-19 NOTE — Patient Instructions (Signed)
No weight-bearing

## 2012-05-19 NOTE — Progress Notes (Signed)
Patient ID: Brittany Beltran, female   DOB: 1980/11/08, 31 y.o.   MRN: [id number] Chief Complaint  Patient presents with  . Routine Post Op    post op 1, Rt ankle, DOS 05/10/12    BP 150/90  Ht 5\' 1"  (1.549 m)  Wt 133 lb (60.328 kg)  BMI 25.13 kg/m2  LMP 04/11/2012  OTIF RIGHT ANKLE   LOOKS GOOD   ANKLE ROM 0-15PF  AIR CAST   DRESSING CHANGE STAPLES AND X-RAYS SEPT 9TH

## 2012-05-20 ENCOUNTER — Telehealth: Payer: Self-pay | Admitting: Orthopedic Surgery

## 2012-05-20 ENCOUNTER — Other Ambulatory Visit: Payer: Self-pay | Admitting: Orthopedic Surgery

## 2012-05-20 DIAGNOSIS — S82843A Displaced bimalleolar fracture of unspecified lower leg, initial encounter for closed fracture: Secondary | ICD-10-CM

## 2012-05-20 MED ORDER — HYDROCODONE-ACETAMINOPHEN 10-325 MG PO TABS: 1.0000 | ORAL_TABLET | ORAL | Status: AC | PRN

## 2012-05-20 NOTE — Telephone Encounter (Signed)
Brittany Beltran wants a new Percocet prescription.  Should you change it, she uses [facility] in [place]

## 2012-05-25 ENCOUNTER — Encounter: Payer: Self-pay | Admitting: Orthopedic Surgery

## 2012-05-25 ENCOUNTER — Ambulatory Visit (INDEPENDENT_AMBULATORY_CARE_PROVIDER_SITE_OTHER): Payer: Self-pay | Admitting: Orthopedic Surgery

## 2012-05-25 ENCOUNTER — Ambulatory Visit (INDEPENDENT_AMBULATORY_CARE_PROVIDER_SITE_OTHER): Payer: Self-pay

## 2012-05-25 VITALS — BP 162/98 | Ht 61.0 in | Wt 133.0 lb

## 2012-05-25 DIAGNOSIS — S82891A Other fracture of right lower leg, initial encounter for closed fracture: Secondary | ICD-10-CM

## 2012-05-25 DIAGNOSIS — S82899A Other fracture of unspecified lower leg, initial encounter for closed fracture: Secondary | ICD-10-CM

## 2012-05-25 DIAGNOSIS — S82853A Displaced trimalleolar fracture of unspecified lower leg, initial encounter for closed fracture: Secondary | ICD-10-CM

## 2012-05-25 DIAGNOSIS — S82851A Displaced trimalleolar fracture of right lower leg, initial encounter for closed fracture: Secondary | ICD-10-CM

## 2012-05-25 NOTE — Patient Instructions (Signed)
Use walker   Keep cast clean and dry

## 2012-05-25 NOTE — Progress Notes (Signed)
Patient ID: Brittany Beltran, female   DOB: [birth year], 31 y.o.   MRN: [id number] Chief Complaint  Patient presents with  . Routine Post Op    post op 2, xray Right ankle, DOS 05/10/12    Two-week postop visit right ankle fracture. The wounds look good the sutures are removed. X-ray was obtained it shows the fracture in good position ankle reduced.  The foot is a little bit swollen more than I like but should be okay for casting  A nonweightbearing short leg cast was applied with appropriate padding  Return in 4 weeks for x-ray out of plaster. She will bring a boot and or her air cast so that if the x-rays look good we can start weightbearing

## 2012-05-26 ENCOUNTER — Encounter (HOSPITAL_COMMUNITY): Payer: Self-pay | Admitting: Orthopedic Surgery

## 2012-06-16 ENCOUNTER — Encounter: Payer: Self-pay | Admitting: Orthopedic Surgery

## 2012-06-23 ENCOUNTER — Ambulatory Visit: Payer: Self-pay | Admitting: Orthopedic Surgery

## 2012-07-01 ENCOUNTER — Ambulatory Visit (INDEPENDENT_AMBULATORY_CARE_PROVIDER_SITE_OTHER): Payer: Self-pay

## 2012-07-01 ENCOUNTER — Ambulatory Visit (INDEPENDENT_AMBULATORY_CARE_PROVIDER_SITE_OTHER): Payer: Self-pay | Admitting: Orthopedic Surgery

## 2012-07-01 ENCOUNTER — Encounter: Payer: Self-pay | Admitting: Orthopedic Surgery

## 2012-07-01 VITALS — BP 170/100 | Ht 61.0 in | Wt 133.0 lb

## 2012-07-01 DIAGNOSIS — S82899A Other fracture of unspecified lower leg, initial encounter for closed fracture: Secondary | ICD-10-CM

## 2012-07-01 DIAGNOSIS — S82891A Other fracture of right lower leg, initial encounter for closed fracture: Secondary | ICD-10-CM

## 2012-07-01 NOTE — Progress Notes (Signed)
Patient ID: Brittany Beltran, female   DOB: 1981-08-03, 31 y.o.   MRN: [id number] Chief Complaint  Patient presents with  . Follow-up    follow up and xray right ankle, DOS 05/10/12    This is postoperative day #52, where the eighth postoperative week. X-ray out of plaster  X-rays show the fracture position is anatomic the fracture is healing  Ankle exam skin intact range of motion limited to 15 global motion  Plan going forward range of motion walker weight bear as tolerated times next month follow up for x-ray in a month exercises daily 3 times a day 30-50 ankle dorsiflexion plantar flexion exercises

## 2012-07-01 NOTE — Patient Instructions (Signed)
Where the range of motion brace for the next month  Remove brace 3 times a day and perform ankle exercises for x50  Start weightbearing as tolerated  About one month for x-ray

## 2012-08-04 ENCOUNTER — Ambulatory Visit (INDEPENDENT_AMBULATORY_CARE_PROVIDER_SITE_OTHER): Payer: Self-pay | Admitting: Orthopedic Surgery

## 2012-08-04 ENCOUNTER — Ambulatory Visit (INDEPENDENT_AMBULATORY_CARE_PROVIDER_SITE_OTHER): Payer: Medicaid Other

## 2012-08-04 VITALS — Ht 61.0 in | Wt 133.0 lb

## 2012-08-04 DIAGNOSIS — S82899A Other fracture of unspecified lower leg, initial encounter for closed fracture: Secondary | ICD-10-CM

## 2012-08-04 NOTE — Patient Instructions (Addendum)
activities as tolerated 

## 2012-08-04 NOTE — Progress Notes (Signed)
Patient ID: Brittany Beltran, female   DOB: [birth year], 32 y.o.   MRN: [id number] Chief Complaint  Patient presents with  . Follow-up    8.25.2013 ankle OTIF   Bimalleolar ankle fracture fixation with Stryker plate and screws, and medial pins x2.  The fracture is healed on x-ray today.  She does have some peroneal muscle spasm, which may be secondary to discomfort, which she said she started having yesterday, which I believed to be secondary to whether related issues.  Her wounds looked clean.  We went over some ankle exercises, which she'll do daily.  She can progress with activities as tolerated and follow up with [place] as needed. She should start weaning process with every every day brace and then wean off the brace over the next 2 weeks

## 2012-09-06 ENCOUNTER — Emergency Department (HOSPITAL_COMMUNITY)
Admission: EM | Admit: 2012-09-06 | Discharge: 2012-09-06 | Disposition: A | Discharge status: Home / Self Care | Payer: Medicaid Other | Attending: Emergency Medicine | Admitting: Emergency Medicine

## 2012-09-06 ENCOUNTER — Encounter (HOSPITAL_COMMUNITY): Payer: Self-pay | Admitting: Emergency Medicine

## 2012-09-06 DIAGNOSIS — Z8742 Personal history of other diseases of the female genital tract: Secondary | ICD-10-CM | POA: Insufficient documentation

## 2012-09-06 DIAGNOSIS — R52 Pain, unspecified: Secondary | ICD-10-CM | POA: Insufficient documentation

## 2012-09-06 DIAGNOSIS — R109 Unspecified abdominal pain: Secondary | ICD-10-CM | POA: Insufficient documentation

## 2012-09-06 DIAGNOSIS — R197 Diarrhea, unspecified: Secondary | ICD-10-CM | POA: Insufficient documentation

## 2012-09-06 DIAGNOSIS — F3289 Other specified depressive episodes: Secondary | ICD-10-CM | POA: Insufficient documentation

## 2012-09-06 DIAGNOSIS — K529 Noninfective gastroenteritis and colitis, unspecified: Secondary | ICD-10-CM

## 2012-09-06 DIAGNOSIS — R61 Generalized hyperhidrosis: Secondary | ICD-10-CM | POA: Insufficient documentation

## 2012-09-06 DIAGNOSIS — F411 Generalized anxiety disorder: Secondary | ICD-10-CM | POA: Insufficient documentation

## 2012-09-06 DIAGNOSIS — R6883 Chills (without fever): Secondary | ICD-10-CM | POA: Insufficient documentation

## 2012-09-06 DIAGNOSIS — F329 Major depressive disorder, single episode, unspecified: Secondary | ICD-10-CM | POA: Insufficient documentation

## 2012-09-06 DIAGNOSIS — K5289 Other specified noninfective gastroenteritis and colitis: Secondary | ICD-10-CM | POA: Insufficient documentation

## 2012-09-06 DIAGNOSIS — Z79899 Other long term (current) drug therapy: Secondary | ICD-10-CM | POA: Insufficient documentation

## 2012-09-06 DIAGNOSIS — I10 Essential (primary) hypertension: Secondary | ICD-10-CM | POA: Insufficient documentation

## 2012-09-06 LAB — URINALYSIS, ROUTINE W REFLEX MICROSCOPIC
Glucose, UA: NEGATIVE mg/dL
Leukocytes, UA: NEGATIVE
Nitrite: NEGATIVE
Protein, ur: NEGATIVE mg/dL

## 2012-09-06 LAB — CBC WITH DIFFERENTIAL/PLATELET
Basophils Absolute: 0 10*3/uL (ref 0.0–0.1)
Eosinophils Relative: 0 % (ref 0–5)
Lymphocytes Relative: 5 % — ABNORMAL LOW (ref 12–46)
Neutro Abs: 10.2 10*3/uL — ABNORMAL HIGH (ref 1.7–7.7)
Platelets: 411 10*3/uL — ABNORMAL HIGH (ref 150–400)
RDW: 13.6 % (ref 11.5–15.5)
WBC: 11.6 10*3/uL — ABNORMAL HIGH (ref 4.0–10.5)

## 2012-09-06 LAB — BASIC METABOLIC PANEL
CO2: 27 mEq/L (ref 19–32)
Calcium: 9.7 mg/dL (ref 8.4–10.5)
Chloride: 96 mEq/L (ref 96–112)
Sodium: 136 mEq/L (ref 135–145)

## 2012-09-06 LAB — POCT PREGNANCY, URINE: Preg Test, Ur: NEGATIVE

## 2012-09-06 MED ORDER — KETOROLAC TROMETHAMINE 30 MG/ML IJ SOLN
30.0000 mg | Freq: Once | INTRAMUSCULAR | Status: AC
  Administered 2012-09-06: 30 mg via INTRAVENOUS
  Filled 2012-09-06: qty 1

## 2012-09-06 MED ORDER — SODIUM CHLORIDE 0.9 % IV BOLUS (SEPSIS)
1000.0000 mL | Freq: Once | INTRAVENOUS | Status: AC
  Administered 2012-09-06: 1000 mL via INTRAVENOUS

## 2012-09-06 MED ORDER — HYDROCODONE-ACETAMINOPHEN 5-325 MG PO TABS: 2.0000 | ORAL_TABLET | ORAL | Status: DC | PRN

## 2012-09-06 MED ORDER — LOPERAMIDE HCL 2 MG PO CAPS
4.0000 mg | ORAL_CAPSULE | Freq: Once | ORAL | Status: AC
  Administered 2012-09-06: 4 mg via ORAL
  Filled 2012-09-06: qty 2

## 2012-09-06 MED ORDER — ONDANSETRON HCL 4 MG/2ML IJ SOLN
4.0000 mg | Freq: Once | INTRAMUSCULAR | Status: AC
  Administered 2012-09-06: 4 mg via INTRAVENOUS
  Filled 2012-09-06: qty 2

## 2012-09-06 MED ORDER — ONDANSETRON HCL 4 MG PO TABS: 4.0000 mg | ORAL_TABLET | Freq: Four times a day (QID) | ORAL | Status: DC | PRN

## 2012-09-06 NOTE — ED Notes (Signed)
Pt c/o sudden n/v/d, body aches and lower abd sharp pains that are intermittant since last night. Tried water this am but came back up. Mm moist. Pt grimacing. Slight gen weakness noted

## 2012-09-06 NOTE — ED Notes (Signed)
Pt unable to void at this time. 

## 2012-09-06 NOTE — ED Provider Notes (Signed)
History   This chart was scribed for Dione Booze, MD by Toya Smothers, ED Scribe. The patient was seen in room [facility]19/[facility]19. Patient's care was started at 0744.  CSN: [id number]  Arrival date & time 09/06/12  0744   None     Chief Complaint  Patient presents with  . Emesis  . Diarrhea  . Generalized Body Aches   HPI  Brittany Beltran is a 31 y.o. female with a h/o HTN and Mastitis, who presents to the Emergency Department complaining of 20 hours of new, constant, unchanged, moderate mid abdominal pain with associate  diaphoresis, chills, generalized body aches, nausea, and multiple episodes of diarrhea and emesis. Pain is 7/10, radiating posteriorly, and sharp. No alleviators or aggravators. Typically healthy, symptoms represent a moderate deviation from baseline health. Symptoms have not been treated PTA. No fever, blood in stool, dysuria, chest pain, SOB, or back pain. She denotes sick contact at home. Pt denies use of tobacco products, consumption of alcohol, and use of illicit drugs. No pertinent surgical Hx listed.    Past Medical History  Diagnosis Date  . Hypertension   . Anxiety   . Depression     post partum  . Allergy   . Breast abscess   . Mastitis     Past Surgical History  Procedure Date  . Incise and drain abcess   . Mouth surgery   . Incision and drainage breast abscess   . Orif ankle fracture 05/10/2012    Procedure: OPEN REDUCTION INTERNAL FIXATION (ORIF) ANKLE FRACTURE;  Surgeon: Vickki Hearing, MD;  Location: [facility];  Service: Orthopedics;  Laterality: Right;    Family History  Problem Relation Age of Onset  . Cancer Mother     breast  . Alcohol abuse Sister     History  Substance Use Topics  . Smoking status: Never Smoker   . Smokeless tobacco: Never Used  . Alcohol Use: No    OB History    Grav Para Term Preterm Abortions TAB SAB Ect Mult Living   1               Review of Systems  Constitutional: Positive for chills and diaphoresis.  Negative for fever.  Gastrointestinal: Positive for abdominal pain and diarrhea.  All other systems reviewed and are negative.    Allergies  Latex; Promethazine hcl; and Vancomycin  Home Medications   Current Outpatient Rx  Name  Route  Sig  Dispense  Refill  . BUSPIRONE HCL 15 MG PO TABS   Oral   Take 15 mg by mouth 2 (two) times daily.          Marland Kitchen MIRTAZAPINE 15 MG PO TABS   Oral   Take 15 mg by mouth at bedtime.         Marland Kitchen ONE-DAILY MULTI VITAMINS PO TABS   Oral   Take 1 tablet by mouth daily.         Marland Kitchen PAROXETINE HCL 30 MG PO TABS   Oral   Take 30 mg by mouth at bedtime.         Marland Kitchen RAMIPRIL 2.5 MG PO CAPS   Oral   Take 2.5 mg by mouth daily.         . SERTRALINE HCL 100 MG PO TABS   Oral   Take 100 mg by mouth daily.           . TRAZODONE HCL 100 MG PO TABS   Oral   Take  100 mg by mouth at bedtime.           Marland Kitchen ZOLPIDEM TARTRATE 10 MG PO TABS   Oral   Take 10 mg by mouth at bedtime.           BP 148/99  Pulse 116  Temp 98.3 F (36.8 C) (Oral)  Resp 18  Ht 5\' 1"  (1.549 m)  Wt 140 lb (63.504 kg)  BMI 26.45 kg/m2  SpO2 95%  LMP 08/30/2012  Physical Exam  Nursing note and vitals reviewed. Constitutional: She is oriented to person, place, and time. She appears well-developed and well-nourished. No distress.  HENT:  Head: Normocephalic and atraumatic.  Eyes: EOM are normal. Pupils are equal, round, and reactive to light.  Neck: Normal range of motion. Neck supple. No tracheal deviation present.  Cardiovascular: Normal rate and regular rhythm.   No murmur heard. Pulmonary/Chest: Effort normal and breath sounds normal.  Abdominal: Soft. Bowel sounds are normal. She exhibits no distension. There is no tenderness.  Musculoskeletal: Normal range of motion. She exhibits no tenderness.  Neurological: She is alert and oriented to person, place, and time.  Skin: Skin is warm and dry.    ED Course  Procedures DIAGNOSTIC STUDIES: Oxygen  Saturation is 95% on room air, adequate by my interpretation.    COORDINATION OF CARE: 07:59- Evaluated Pt. Pt is awake, alert, and without distress. 08:02- Ordered Basic metabolic panel, CBC with Differential, and Urinalysis, Routine w reflex microscopic. 08:06- Patient understand and agree with initial ED impression and plan with expectations set for ED visit. 08:52- Rechecked Pt. Pt is feeling moderately improved after treatment and observation.    Results for orders placed during the hospital encounter of 09/06/12  CBC WITH DIFFERENTIAL      Component Value Range   WBC 11.6 (*) 4.0 - 10.5 K/uL   RBC 4.47  3.87 - 5.11 MIL/uL   Hemoglobin 13.8  12.0 - 15.0 g/dL   HCT 13.2  44.0 - 10.2 %   MCV 90.4  78.0 - 100.0 fL   MCH 30.9  26.0 - 34.0 pg   MCHC 34.2  30.0 - 36.0 g/dL   RDW 72.5  36.6 - 44.0 %   Platelets 411 (*) 150 - 400 K/uL   Neutrophils Relative 88 (*) 43 - 77 %   Neutro Abs 10.2 (*) 1.7 - 7.7 K/uL   Lymphocytes Relative 5 (*) 12 - 46 %   Lymphs Abs 0.6 (*) 0.7 - 4.0 K/uL   Monocytes Relative 7  3 - 12 %   Monocytes Absolute 0.8  0.1 - 1.0 K/uL   Eosinophils Relative 0  0 - 5 %   Eosinophils Absolute 0.0  0.0 - 0.7 K/uL   Basophils Relative 0  0 - 1 %   Basophils Absolute 0.0  0.0 - 0.1 K/uL  BASIC METABOLIC PANEL      Component Value Range   Sodium 136  135 - 145 mEq/L   Potassium 3.6  3.5 - 5.1 mEq/L   Chloride 96  96 - 112 mEq/L   CO2 27  19 - 32 mEq/L   Glucose, Bld 144 (*) 70 - 99 mg/dL   BUN 17  6 - 23 mg/dL   Creatinine, Ser 3.47  0.50 - 1.10 mg/dL   Calcium 9.7  8.4 - 42.5 mg/dL   GFR calc non Af Amer >90  >90 mL/min   GFR calc Af Amer >90  >90 mL/min  URINALYSIS, ROUTINE W REFLEX  MICROSCOPIC      Component Value Range   Color, Urine YELLOW  YELLOW   APPearance CLEAR  CLEAR   Specific Gravity, Urine 1.020  1.005 - 1.030   pH 7.5  5.0 - 8.0   Glucose, UA NEGATIVE  NEGATIVE mg/dL   Hgb urine dipstick TRACE (*) NEGATIVE   Bilirubin Urine NEGATIVE   NEGATIVE   Ketones, ur 15 (*) NEGATIVE mg/dL   Protein, ur NEGATIVE  NEGATIVE mg/dL   Urobilinogen, UA 0.2  0.0 - 1.0 mg/dL   Nitrite NEGATIVE  NEGATIVE   Leukocytes, UA NEGATIVE  NEGATIVE  POCT PREGNANCY, URINE      Component Value Range   Preg Test, Ur NEGATIVE  NEGATIVE  URINE MICROSCOPIC-ADD ON      Component Value Range   Squamous Epithelial / LPF FEW (*) RARE   RBC / HPF 3-6  <3 RBC/hpf      1. Gastroenteritis       MDM  Nausea, vomiting, diarrhea, body aches most consistent with viral gastroenteritis. She will be given IV fluids, IV antiemetics, and oral antidiarrheal agents and reassessed. She'll be given ketorolac for pain.  She feels somewhat better after the above noted that treatment. She is sent home with prescription for ondansetron for nausea and Norco for pain and is to use over-the-counter loperamide as needed for diarrhea.  I personally performed the services described in this documentation, which was scribed in my presence. The recorded information has been reviewed and is accurate.     Dione Booze, MD 09/06/12 1036

## 2013-01-13 ENCOUNTER — Emergency Department (HOSPITAL_BASED_OUTPATIENT_CLINIC_OR_DEPARTMENT_OTHER)
Admission: EM | Admit: 2013-01-13 | Discharge: 2013-01-13 | Disposition: A | Discharge status: Home / Self Care | Payer: Medicaid Other | Attending: Emergency Medicine | Admitting: Emergency Medicine

## 2013-01-13 ENCOUNTER — Encounter (HOSPITAL_BASED_OUTPATIENT_CLINIC_OR_DEPARTMENT_OTHER): Payer: Self-pay | Admitting: Family Medicine

## 2013-01-13 DIAGNOSIS — R350 Frequency of micturition: Secondary | ICD-10-CM | POA: Insufficient documentation

## 2013-01-13 DIAGNOSIS — I10 Essential (primary) hypertension: Secondary | ICD-10-CM | POA: Insufficient documentation

## 2013-01-13 DIAGNOSIS — F329 Major depressive disorder, single episode, unspecified: Secondary | ICD-10-CM | POA: Insufficient documentation

## 2013-01-13 DIAGNOSIS — Z3202 Encounter for pregnancy test, result negative: Secondary | ICD-10-CM | POA: Insufficient documentation

## 2013-01-13 DIAGNOSIS — F411 Generalized anxiety disorder: Secondary | ICD-10-CM | POA: Insufficient documentation

## 2013-01-13 DIAGNOSIS — Z8742 Personal history of other diseases of the female genital tract: Secondary | ICD-10-CM | POA: Insufficient documentation

## 2013-01-13 DIAGNOSIS — Z79899 Other long term (current) drug therapy: Secondary | ICD-10-CM | POA: Insufficient documentation

## 2013-01-13 DIAGNOSIS — F3289 Other specified depressive episodes: Secondary | ICD-10-CM | POA: Insufficient documentation

## 2013-01-13 DIAGNOSIS — R509 Fever, unspecified: Secondary | ICD-10-CM | POA: Insufficient documentation

## 2013-01-13 DIAGNOSIS — N390 Urinary tract infection, site not specified: Secondary | ICD-10-CM | POA: Insufficient documentation

## 2013-01-13 HISTORY — DX: Headache: R51

## 2013-01-13 LAB — PREGNANCY, URINE: Preg Test, Ur: NEGATIVE

## 2013-01-13 LAB — URINE MICROSCOPIC-ADD ON

## 2013-01-13 LAB — URINALYSIS, ROUTINE W REFLEX MICROSCOPIC
Bilirubin Urine: NEGATIVE
Protein, ur: NEGATIVE mg/dL
Urobilinogen, UA: 0.2 mg/dL (ref 0.0–1.0)

## 2013-01-13 MED ORDER — ONDANSETRON 8 MG PO TBDP: 8.0000 mg | ORAL_TABLET | Freq: Three times a day (TID) | ORAL | Status: DC | PRN

## 2013-01-13 MED ORDER — ONDANSETRON 8 MG PO TBDP
8.0000 mg | ORAL_TABLET | Freq: Once | ORAL | Status: AC
  Administered 2013-01-13: 8 mg via ORAL
  Filled 2013-01-13: qty 1

## 2013-01-13 MED ORDER — CEPHALEXIN 250 MG PO CAPS: 250.0000 mg | ORAL_CAPSULE | Freq: Four times a day (QID) | ORAL | Status: DC

## 2013-01-13 NOTE — ED Notes (Signed)
Pt report stopping all of her meds "cold [place]".

## 2013-01-13 NOTE — ED Notes (Signed)
Pt c/o n/v and frequent urination x 2 days. Pt sts she has felt "hot then cold" since last night. Pt also c/o of red spot on right breast.

## 2013-01-13 NOTE — ED Provider Notes (Signed)
History     CSN: [id number]  Arrival date & time 01/13/13  9147   First MD Initiated Contact with Patient 01/13/13 [phone]      Chief Complaint  Patient presents with  . Emesis    (Consider location/radiation/quality/duration/timing/severity/associated sxs/prior treatment) HPI  Patient complaining of nausea and vomiting last night with subjective fever and chills.  States she has had utis and has frequency of urination.  She states lmp last week and normal.  No vomiting here.  She has mild left cva discomfort.    Past Medical History  Diagnosis Date  . Hypertension   . Anxiety   . Depression     post partum  . Allergy   . Breast abscess   . Mastitis   . Headache     Past Surgical History  Procedure Laterality Date  . Incise and drain abcess    . Mouth surgery    . Incision and drainage breast abscess    . Orif ankle fracture  05/10/2012    Procedure: OPEN REDUCTION INTERNAL FIXATION (ORIF) ANKLE FRACTURE;  Surgeon: Vickki Hearing, MD;  Location: [facility];  Service: Orthopedics;  Laterality: Right;    Family History  Problem Relation Age of Onset  . Cancer Mother     breast  . Alcohol abuse Sister     History  Substance Use Topics  . Smoking status: Never Smoker   . Smokeless tobacco: Never Used  . Alcohol Use: No    OB History   Grav Para Term Preterm Abortions TAB SAB Ect Mult Living   1               Review of Systems  All other systems reviewed and are negative.    Allergies  Promethazine hcl; Vancomycin; Adhesive; and Latex  Home Medications   Current Outpatient Rx  Name  Route  Sig  Dispense  Refill  . busPIRone (BUSPAR) 15 MG tablet   Oral   Take 15 mg by mouth 4 (four) times daily.          Marland Kitchen HYDROcodone-acetaminophen (NORCO/VICODIN) 5-325 MG per tablet   Oral   Take 2 tablets by mouth every 4 (four) hours as needed for pain.   10 tablet   0   . mirtazapine (REMERON) 15 MG tablet   Oral   Take 15 mg by mouth at bedtime.        . Multiple Vitamin (MULTIVITAMIN) tablet   Oral   Take 1 tablet by mouth daily.         . ondansetron (ZOFRAN) 4 MG tablet   Oral   Take 1 tablet (4 mg total) by mouth every 6 (six) hours as needed for nausea.   12 tablet   0   . PARoxetine (PAXIL) 30 MG tablet   Oral   Take 30 mg by mouth at bedtime.         . ramipril (ALTACE) 2.5 MG capsule   Oral   Take 2.5 mg by mouth daily.         . sertraline (ZOLOFT) 100 MG tablet   Oral   Take 100 mg by mouth daily.           Marland Kitchen zolpidem (AMBIEN) 10 MG tablet   Oral   Take 10 mg by mouth at bedtime.           BP 139/94  Pulse 80  Temp(Src) 97.8 F (36.6 C) (Oral)  Resp 18  Ht 5'  1" (1.549 m)  Wt 135 lb (61.236 kg)  BMI 25.52 kg/m2  SpO2 100%  Physical Exam  Nursing note and vitals reviewed. Constitutional: She is oriented to person, place, and time. She appears well-developed and well-nourished.  HENT:  Head: Normocephalic and atraumatic.  Right Ear: External ear normal.  Left Ear: External ear normal.  Eyes: Conjunctivae are normal. Pupils are equal, round, and reactive to light.  Neck: Normal range of motion. Neck supple.  Cardiovascular: Normal rate and regular rhythm.   Pulmonary/Chest: Effort normal and breath sounds normal.  Abdominal: Soft. Bowel sounds are normal.  Musculoskeletal:  Left cva ttp  Neurological: She is alert and oriented to person, place, and time. She has normal reflexes.  Skin: Skin is warm and dry.  Psychiatric: She has a normal mood and affect. Her behavior is normal. Judgment and thought content normal.    ED Course  Procedures (including critical care time)  Labs Reviewed  URINALYSIS, ROUTINE W REFLEX MICROSCOPIC - Abnormal; Notable for the following:    APPearance CLOUDY (*)    Hgb urine dipstick SMALL (*)    Ketones, ur 15 (*)    All other components within normal limits  URINE MICROSCOPIC-ADD ON - Abnormal; Notable for the following:    Bacteria, UA MANY (*)     All other components within normal limits  PREGNANCY, URINE   No results found.   No diagnosis found.    MDM  Patient with urine c.w. uti and treated with keflex.  No vomiting here.  Zofran given.          Hilario Quarry, MD 01/13/13 1226

## 2013-01-15 ENCOUNTER — Emergency Department (HOSPITAL_BASED_OUTPATIENT_CLINIC_OR_DEPARTMENT_OTHER)
Admission: EM | Admit: 2013-01-15 | Discharge: 2013-01-15 | Disposition: A | Discharge status: Home / Self Care | Payer: Medicaid Other | Attending: Emergency Medicine | Admitting: Emergency Medicine

## 2013-01-15 ENCOUNTER — Encounter (HOSPITAL_BASED_OUTPATIENT_CLINIC_OR_DEPARTMENT_OTHER): Payer: Self-pay

## 2013-01-15 ENCOUNTER — Emergency Department (HOSPITAL_BASED_OUTPATIENT_CLINIC_OR_DEPARTMENT_OTHER): Payer: Medicaid Other

## 2013-01-15 DIAGNOSIS — F3289 Other specified depressive episodes: Secondary | ICD-10-CM | POA: Insufficient documentation

## 2013-01-15 DIAGNOSIS — R350 Frequency of micturition: Secondary | ICD-10-CM | POA: Insufficient documentation

## 2013-01-15 DIAGNOSIS — Z91040 Latex allergy status: Secondary | ICD-10-CM | POA: Insufficient documentation

## 2013-01-15 DIAGNOSIS — R109 Unspecified abdominal pain: Secondary | ICD-10-CM | POA: Insufficient documentation

## 2013-01-15 DIAGNOSIS — N898 Other specified noninflammatory disorders of vagina: Secondary | ICD-10-CM | POA: Insufficient documentation

## 2013-01-15 DIAGNOSIS — I10 Essential (primary) hypertension: Secondary | ICD-10-CM | POA: Insufficient documentation

## 2013-01-15 DIAGNOSIS — F329 Major depressive disorder, single episode, unspecified: Secondary | ICD-10-CM | POA: Insufficient documentation

## 2013-01-15 DIAGNOSIS — R110 Nausea: Secondary | ICD-10-CM | POA: Insufficient documentation

## 2013-01-15 DIAGNOSIS — Z8742 Personal history of other diseases of the female genital tract: Secondary | ICD-10-CM | POA: Insufficient documentation

## 2013-01-15 DIAGNOSIS — K5900 Constipation, unspecified: Secondary | ICD-10-CM | POA: Insufficient documentation

## 2013-01-15 DIAGNOSIS — M549 Dorsalgia, unspecified: Secondary | ICD-10-CM | POA: Insufficient documentation

## 2013-01-15 DIAGNOSIS — R3915 Urgency of urination: Secondary | ICD-10-CM | POA: Insufficient documentation

## 2013-01-15 DIAGNOSIS — Z79899 Other long term (current) drug therapy: Secondary | ICD-10-CM | POA: Insufficient documentation

## 2013-01-15 DIAGNOSIS — F411 Generalized anxiety disorder: Secondary | ICD-10-CM | POA: Insufficient documentation

## 2013-01-15 LAB — URINALYSIS, ROUTINE W REFLEX MICROSCOPIC
Nitrite: NEGATIVE
Specific Gravity, Urine: 1.027 (ref 1.005–1.030)
Urobilinogen, UA: 0.2 mg/dL (ref 0.0–1.0)

## 2013-01-15 LAB — CBC WITH DIFFERENTIAL/PLATELET
Eosinophils Absolute: 0.1 10*3/uL (ref 0.0–0.7)
Lymphocytes Relative: 28 % (ref 12–46)
Lymphs Abs: 2.8 10*3/uL (ref 0.7–4.0)
Neutrophils Relative %: 62 % (ref 43–77)
Platelets: 275 10*3/uL (ref 150–400)
RBC: 4.32 MIL/uL (ref 3.87–5.11)
WBC: 9.8 10*3/uL (ref 4.0–10.5)

## 2013-01-15 LAB — WET PREP, GENITAL

## 2013-01-15 LAB — URINE MICROSCOPIC-ADD ON

## 2013-01-15 LAB — COMPREHENSIVE METABOLIC PANEL
ALT: 13 U/L (ref 0–35)
Alkaline Phosphatase: 83 U/L (ref 39–117)
CO2: 23 mEq/L (ref 19–32)
GFR calc Af Amer: >90 mL/min (ref >90)
Glucose, Bld: 96 mg/dL (ref 70–99)
Potassium: 3.9 mEq/L (ref 3.5–5.1)
Sodium: 139 mEq/L (ref 135–145)
Total Protein: 7.3 g/dL (ref 6.0–8.3)

## 2013-01-15 MED ORDER — METOCLOPRAMIDE HCL 10 MG PO TABS: 10.0000 mg | ORAL_TABLET | Freq: Four times a day (QID) | ORAL | Status: DC | PRN

## 2013-01-15 MED ORDER — HYDROMORPHONE HCL PF 1 MG/ML IJ SOLN
1.0000 mg | Freq: Once | INTRAMUSCULAR | Status: AC
  Administered 2013-01-15: 1 mg via INTRAVENOUS
  Filled 2013-01-15: qty 1

## 2013-01-15 MED ORDER — SODIUM CHLORIDE 0.9 % IV BOLUS (SEPSIS)
1000.0000 mL | Freq: Once | INTRAVENOUS | Status: AC
  Administered 2013-01-15: 1000 mL via INTRAVENOUS

## 2013-01-15 MED ORDER — ONDANSETRON HCL 4 MG/2ML IJ SOLN
4.0000 mg | Freq: Once | INTRAMUSCULAR | Status: AC
  Administered 2013-01-15: 4 mg via INTRAVENOUS
  Filled 2013-01-15: qty 2

## 2013-01-15 MED ORDER — METOCLOPRAMIDE HCL 5 MG/ML IJ SOLN
10.0000 mg | Freq: Once | INTRAMUSCULAR | Status: AC
  Administered 2013-01-15: 10 mg via INTRAVENOUS
  Filled 2013-01-15: qty 2

## 2013-01-15 NOTE — ED Notes (Signed)
Pt reports abdominal and back pain.  She was seen in ED 2 days ago and is taking PO antibiotic and nausea medication but now has pain.

## 2013-01-15 NOTE — ED Provider Notes (Signed)
History     CSN: [id number]  Arrival date & time 01/15/13  0902   First MD Initiated Contact with Patient 01/15/13 [phone]      Chief Complaint  Patient presents with  . Abdominal Pain  . Back Pain  . Urinary Urgency    (Consider location/radiation/quality/duration/timing/severity/associated sxs/prior treatment) Patient is a 32 y.o. female presenting with abdominal pain and back pain.  Abdominal Pain Back Pain Associated symptoms: abdominal pain    Pt with history of UTI was seen 2 days ago with complaints of urinary frequency and nausea. She had UA with many bacteria and started on Keflex/Zofran. She was not having pain at that time, but since has gradually developed severe aching constant lower abdominal and back pain, worse on the left. She has continued to have urinary frequency but denies fever or nausea. She reports a mild vaginal discharge. No bleeding. She has had kidney stones before, this feels similar.   Past Medical History  Diagnosis Date  . Hypertension   . Anxiety   . Depression     post partum  . Allergy   . Breast abscess   . Mastitis   . Headache     Past Surgical History  Procedure Laterality Date  . Incise and drain abcess    . Mouth surgery    . Incision and drainage breast abscess    . Orif ankle fracture  05/10/2012    Procedure: OPEN REDUCTION INTERNAL FIXATION (ORIF) ANKLE FRACTURE;  Surgeon: Vickki Hearing, MD;  Location: [facility];  Service: Orthopedics;  Laterality: Right;    Family History  Problem Relation Age of Onset  . Cancer Mother     breast  . Alcohol abuse Sister     History  Substance Use Topics  . Smoking status: Never Smoker   . Smokeless tobacco: Never Used  . Alcohol Use: No    OB History   Grav Para Term Preterm Abortions TAB SAB Ect Mult Living   1               Review of Systems  Gastrointestinal: Positive for abdominal pain.  Musculoskeletal: Positive for back pain.   All other systems reviewed and are  negative except as noted in HPI.   Allergies  Promethazine hcl; Vancomycin; Adhesive; and Latex  Home Medications   Current Outpatient Rx  Name  Route  Sig  Dispense  Refill  . busPIRone (BUSPAR) 15 MG tablet   Oral   Take 15 mg by mouth 4 (four) times daily.          . cephALEXin (KEFLEX) 250 MG capsule   Oral   Take 1 capsule (250 mg total) by mouth 4 (four) times daily.   28 capsule   0   . mirtazapine (REMERON) 15 MG tablet   Oral   Take 15 mg by mouth at bedtime.         . Multiple Vitamin (MULTIVITAMIN) tablet   Oral   Take 1 tablet by mouth daily.         . ondansetron (ZOFRAN ODT) 8 MG disintegrating tablet   Oral   Take 1 tablet (8 mg total) by mouth every 8 (eight) hours as needed for nausea.   20 tablet   0   . ondansetron (ZOFRAN) 4 MG tablet   Oral   Take 1 tablet (4 mg total) by mouth every 6 (six) hours as needed for nausea.   12 tablet   0   .  PARoxetine (PAXIL) 30 MG tablet   Oral   Take 30 mg by mouth at bedtime.         . ramipril (ALTACE) 2.5 MG capsule   Oral   Take 2.5 mg by mouth daily.         . sertraline (ZOLOFT) 100 MG tablet   Oral   Take 100 mg by mouth daily.           Marland Kitchen zolpidem (AMBIEN) 10 MG tablet   Oral   Take 10 mg by mouth at bedtime.           BP 143/94  Pulse 75  Temp(Src) 97.8 F (36.6 C) (Oral)  Resp 18  SpO2 100%  LMP 12/16/2012  Physical Exam  Nursing note and vitals reviewed. Constitutional: She is oriented to person, place, and time. She appears well-developed and well-nourished.  HENT:  Head: Normocephalic and atraumatic.  Eyes: EOM are normal. Pupils are equal, round, and reactive to light.  Neck: Normal range of motion. Neck supple.  Cardiovascular: Normal rate, normal heart sounds and intact distal pulses.   Pulmonary/Chest: Effort normal and breath sounds normal.  Abdominal: Bowel sounds are normal. She exhibits no distension. There is tenderness (bilateral lower abdomen  tenderness, L>R). There is no rebound and no guarding.  Genitourinary: Cervix exhibits discharge. Cervix exhibits no motion tenderness and no friability. Right adnexum displays no mass and no tenderness. Left adnexum displays no mass and no tenderness. No bleeding around the vagina. Vaginal discharge found.  Mild white vaginal discharge  Musculoskeletal: Normal range of motion. She exhibits no edema and no tenderness.  Neurological: She is alert and oriented to person, place, and time. She has normal strength. No cranial nerve deficit or sensory deficit.  Skin: Skin is warm and dry. No rash noted.  Psychiatric: She has a normal mood and affect.    ED Course  Procedures (including critical care time)  Labs Reviewed  WET PREP, GENITAL - Abnormal; Notable for the following:    Clue Cells Wet Prep HPF POC FEW (*)    WBC, Wet Prep HPF POC MODERATE (*)    All other components within normal limits  URINALYSIS, ROUTINE W REFLEX MICROSCOPIC - Abnormal; Notable for the following:    APPearance CLOUDY (*)    Hgb urine dipstick MODERATE (*)    All other components within normal limits  COMPREHENSIVE METABOLIC PANEL - Abnormal; Notable for the following:    Total Bilirubin 0.1 (*)    All other components within normal limits  URINE MICROSCOPIC-ADD ON - Abnormal; Notable for the following:    Squamous Epithelial / LPF FEW (*)    Bacteria, UA MANY (*)    Crystals CA OXALATE CRYSTALS (*)    All other components within normal limits  GC/CHLAMYDIA PROBE AMP  CBC WITH DIFFERENTIAL   Ct Abdomen Pelvis Wo Contrast  01/15/2013  *RADIOLOGY REPORT*  Clinical Data: Left lower quadrant and left flank pain.  History of kidney stones.  CT ABDOMEN AND PELVIS WITHOUT CONTRAST  Technique:  Multidetector CT imaging of the abdomen and pelvis was performed following the standard protocol without intravenous contrast.  Comparison: 01/20/2010.  Findings: Lung bases show no acute findings.  Heart size normal. No  pericardial or pleural effusion.  Liver, gallbladder and adrenal glands are unremarkable.  A 5 mm stone is seen in the lower pole right kidney.  Punctate stone in the lower pole left kidney.  No hydronephrosis.  Spleen, pancreas, stomach, small bowel,  appendix and proximal colon are unremarkable. A fair amount of stool is seen within the colon.  Uterus is visualized.  Ovaries are not well seen separate from the uterus.  No pathologically enlarged lymph nodes.  There may be trace pelvic free fluid.  No worrisome lytic or sclerotic lesions.  IMPRESSION:  1.  No acute findings to explain the patient's given symptoms. 2.  Bilateral renal stones. 3.  Question constipation.   Original Report Authenticated By: Leanna Battles, M.D.      1. Constipation   2. Abdominal pain       MDM  Recheck UA, no culture done due to minimal signs of infection. Will also check pelvic labs, send blood work and CT for renal stone. Pain and nausea meds and IVF.    11:10 AM Labs and imaging reviewed. Pain resolved, still some nausea. No signs of worsening UTI or significant pelvic infection. CT neg for ureteral stone, suspect her symptoms are from constipation. Has not had BM in 3 days. Advised to stop Zofran, cannot take Phenergan, but has had Reglan in the past without difficulty.       Charles B. Bernette Mayers, MD 01/15/13 1115

## 2013-01-15 NOTE — ED Notes (Signed)
Patient asked to change into gown. 

## 2013-01-15 NOTE — ED Notes (Signed)
Patient transported to CT 

## 2013-01-16 LAB — GC/CHLAMYDIA PROBE AMP: GC Probe RNA: NEGATIVE

## 2013-01-19 ENCOUNTER — Emergency Department (HOSPITAL_BASED_OUTPATIENT_CLINIC_OR_DEPARTMENT_OTHER)
Admission: EM | Admit: 2013-01-19 | Discharge: 2013-01-19 | Disposition: A | Discharge status: Home / Self Care | Payer: Medicaid Other | Attending: Emergency Medicine | Admitting: Emergency Medicine

## 2013-01-19 ENCOUNTER — Encounter (HOSPITAL_BASED_OUTPATIENT_CLINIC_OR_DEPARTMENT_OTHER): Payer: Self-pay

## 2013-01-19 DIAGNOSIS — Z3202 Encounter for pregnancy test, result negative: Secondary | ICD-10-CM | POA: Insufficient documentation

## 2013-01-19 DIAGNOSIS — F3289 Other specified depressive episodes: Secondary | ICD-10-CM | POA: Insufficient documentation

## 2013-01-19 DIAGNOSIS — N390 Urinary tract infection, site not specified: Secondary | ICD-10-CM | POA: Insufficient documentation

## 2013-01-19 DIAGNOSIS — R3915 Urgency of urination: Secondary | ICD-10-CM | POA: Insufficient documentation

## 2013-01-19 DIAGNOSIS — F411 Generalized anxiety disorder: Secondary | ICD-10-CM | POA: Insufficient documentation

## 2013-01-19 DIAGNOSIS — Z91040 Latex allergy status: Secondary | ICD-10-CM | POA: Insufficient documentation

## 2013-01-19 DIAGNOSIS — Z8742 Personal history of other diseases of the female genital tract: Secondary | ICD-10-CM | POA: Insufficient documentation

## 2013-01-19 DIAGNOSIS — F329 Major depressive disorder, single episode, unspecified: Secondary | ICD-10-CM | POA: Insufficient documentation

## 2013-01-19 DIAGNOSIS — I10 Essential (primary) hypertension: Secondary | ICD-10-CM | POA: Insufficient documentation

## 2013-01-19 DIAGNOSIS — R112 Nausea with vomiting, unspecified: Secondary | ICD-10-CM | POA: Insufficient documentation

## 2013-01-19 DIAGNOSIS — R32 Unspecified urinary incontinence: Secondary | ICD-10-CM | POA: Insufficient documentation

## 2013-01-19 DIAGNOSIS — Z79899 Other long term (current) drug therapy: Secondary | ICD-10-CM | POA: Insufficient documentation

## 2013-01-19 DIAGNOSIS — R109 Unspecified abdominal pain: Secondary | ICD-10-CM

## 2013-01-19 DIAGNOSIS — R1084 Generalized abdominal pain: Secondary | ICD-10-CM | POA: Insufficient documentation

## 2013-01-19 LAB — URINALYSIS, ROUTINE W REFLEX MICROSCOPIC
Bilirubin Urine: NEGATIVE
Glucose, UA: NEGATIVE mg/dL
Specific Gravity, Urine: 1.023 (ref 1.005–1.030)
Urobilinogen, UA: 0.2 mg/dL (ref 0.0–1.0)
pH: 5.5 (ref 5.0–8.0)

## 2013-01-19 LAB — COMPREHENSIVE METABOLIC PANEL
ALT: 11 U/L (ref 0–35)
Alkaline Phosphatase: 87 U/L (ref 39–117)
BUN: 8 mg/dL (ref 6–23)
CO2: 23 mEq/L (ref 19–32)
Calcium: 9.8 mg/dL (ref 8.4–10.5)
GFR calc Af Amer: >90 mL/min (ref >90)
GFR calc non Af Amer: >90 mL/min (ref >90)
Glucose, Bld: 97 mg/dL (ref 70–99)
Total Protein: 8.1 g/dL (ref 6.0–8.3)

## 2013-01-19 LAB — CBC WITH DIFFERENTIAL/PLATELET
Eosinophils Absolute: 0.1 10*3/uL (ref 0.0–0.7)
Eosinophils Relative: 1 % (ref 0–5)
HCT: 40.3 % (ref 36.0–46.0)
Hemoglobin: 14 g/dL (ref 12.0–15.0)
Lymphocytes Relative: 26 % (ref 12–46)
Lymphs Abs: 2 10*3/uL (ref 0.7–4.0)
MCH: 30.6 pg (ref 26.0–34.0)
MCV: 88.2 fL (ref 78.0–100.0)
Monocytes Relative: 8 % (ref 3–12)
RBC: 4.57 MIL/uL (ref 3.87–5.11)
WBC: 7.4 10*3/uL (ref 4.0–10.5)

## 2013-01-19 LAB — URINE MICROSCOPIC-ADD ON

## 2013-01-19 MED ORDER — SODIUM CHLORIDE 0.9 % IV SOLN
1000.0000 mL | Freq: Once | INTRAVENOUS | Status: AC
  Administered 2013-01-19: 1000 mL via INTRAVENOUS

## 2013-01-19 MED ORDER — DIPHENHYDRAMINE HCL 50 MG/ML IJ SOLN
25.0000 mg | Freq: Once | INTRAMUSCULAR | Status: AC
  Administered 2013-01-19: 25 mg via INTRAVENOUS
  Filled 2013-01-19: qty 1

## 2013-01-19 MED ORDER — METOCLOPRAMIDE HCL 5 MG/ML IJ SOLN
10.0000 mg | Freq: Once | INTRAMUSCULAR | Status: AC
  Administered 2013-01-19: 10 mg via INTRAVENOUS
  Filled 2013-01-19: qty 2

## 2013-01-19 MED ORDER — SULFAMETHOXAZOLE-TRIMETHOPRIM 800-160 MG PO TABS: 1.0000 | ORAL_TABLET | Freq: Two times a day (BID) | ORAL | Status: DC

## 2013-01-19 MED ORDER — SULFAMETHOXAZOLE-TMP DS 800-160 MG PO TABS: 1.0000 | ORAL_TABLET | Freq: Once | ORAL | Status: DC

## 2013-01-19 MED ORDER — SODIUM CHLORIDE 0.9 % IV SOLN: 1000.0000 mL | INTRAVENOUS | Status: DC

## 2013-01-19 MED ORDER — MORPHINE SULFATE 4 MG/ML IJ SOLN
4.0000 mg | Freq: Once | INTRAMUSCULAR | Status: AC
  Administered 2013-01-19: 4 mg via INTRAVENOUS
  Filled 2013-01-19: qty 1

## 2013-01-19 NOTE — ED Notes (Signed)
Pt reports urinary incontinence that started yesterday, associated with vomiting yesterday, diarrhea and abdominal pain described as burning.  She was seen last week on Wednesday and Friday and states she isn't improving.

## 2013-01-19 NOTE — ED Provider Notes (Signed)
History     CSN: [id number]  Arrival date & time 01/19/13  1000   First MD Initiated Contact with Patient 01/19/13 1056      Chief Complaint  Patient presents with  . Abdominal Pain  . Urinary Incontinence    (Consider location/radiation/quality/duration/timing/severity/associated sxs/prior treatment) Patient is a 32 y.o. female presenting with abdominal pain. The history is provided by the patient.  Abdominal Pain She has been sick for the last week with crampy abdominal pain, urinary urgency, nausea, vomiting. She seen in the ED initially and found to have a UTI and started on cephalexin but symptoms did not improve. She returned to the ED and CT scan showed possible constipation and the incidental finding of nephrolithiasis. She has been taking ondansetron and metoclopramide for nausea with only minimal relief. Her bowels are now working and she is having loose bowel movements. Abdominal pain is moderate to severe and she rates it an 8/10. She denies fever, chills, sweats. She has had some urinary incontinence which she has had in the past with urinary infections.  Past Medical History  Diagnosis Date  . Hypertension   . Anxiety   . Depression     post partum  . Allergy   . Breast abscess   . Mastitis   . Headache     Past Surgical History  Procedure Laterality Date  . Incise and drain abcess    . Mouth surgery    . Incision and drainage breast abscess    . Orif ankle fracture  05/10/2012    Procedure: OPEN REDUCTION INTERNAL FIXATION (ORIF) ANKLE FRACTURE;  Surgeon: Vickki Hearing, MD;  Location: [facility];  Service: Orthopedics;  Laterality: Right;    Family History  Problem Relation Age of Onset  . Cancer Mother     breast  . Alcohol abuse Sister     History  Substance Use Topics  . Smoking status: Never Smoker   . Smokeless tobacco: Never Used  . Alcohol Use: No    OB History   Grav Para Term Preterm Abortions TAB SAB Ect Mult Living   1                Review of Systems  Gastrointestinal: Positive for abdominal pain.  All other systems reviewed and are negative.    Allergies  Promethazine hcl; Vancomycin; Adhesive; and Latex  Home Medications   Current Outpatient Rx  Name  Route  Sig  Dispense  Refill  . busPIRone (BUSPAR) 15 MG tablet   Oral   Take 15 mg by mouth 4 (four) times daily.          . cephALEXin (KEFLEX) 250 MG capsule   Oral   Take 1 capsule (250 mg total) by mouth 4 (four) times daily.   28 capsule   0   . metoCLOPramide (REGLAN) 10 MG tablet   Oral   Take 1 tablet (10 mg total) by mouth every 6 (six) hours as needed (nausea).   30 tablet   0   . mirtazapine (REMERON) 15 MG tablet   Oral   Take 15 mg by mouth at bedtime.         . Multiple Vitamin (MULTIVITAMIN) tablet   Oral   Take 1 tablet by mouth daily.         . ondansetron (ZOFRAN) 4 MG tablet   Oral   Take 1 tablet (4 mg total) by mouth every 6 (six) hours as needed for nausea.  12 tablet   0   . PARoxetine (PAXIL) 30 MG tablet   Oral   Take 30 mg by mouth at bedtime.         . ramipril (ALTACE) 2.5 MG capsule   Oral   Take 2.5 mg by mouth daily.         . sertraline (ZOLOFT) 100 MG tablet   Oral   Take 100 mg by mouth daily.           Marland Kitchen zolpidem (AMBIEN) 10 MG tablet   Oral   Take 10 mg by mouth at bedtime.           BP 144/93  Pulse 75  Temp(Src) 98 F (36.7 C) (Oral)  Resp 16  Ht 5\' 1"  (1.549 m)  Wt 130 lb (58.968 kg)  BMI 24.58 kg/m2  SpO2 99%  LMP 12/16/2012  Physical Exam  Nursing note and vitals reviewed.  32 year old female, resting comfortably and in no acute distress. Vital signs are significant for mild hypertension with blood pressure 144/93. Oxygen saturation is 99%, which is normal. Head is normocephalic and atraumatic. PERRLA, EOMI. Oropharynx is clear. Neck is nontender and supple without adenopathy or JVD. Back is nontender and there is no CVA tenderness. Lungs are clear without  rales, wheezes, or rhonchi. Chest is nontender. Heart has regular rate and rhythm without murmur. Abdomen is soft, flat, with mild tenderness diffusely. There is no rebound or guarding. There are no masses or hepatosplenomegaly and peristalsis is hypoactive. Extremities have no cyanosis or edema, full range of motion is present. Skin is warm and dry without rash. Neurologic: Mental status is normal, cranial nerves are intact, there are no motor or sensory deficits.  ED Course  Procedures (including critical care time)  Results for orders placed during the hospital encounter of 01/19/13  PREGNANCY, URINE      Result Value Range   Preg Test, Ur NEGATIVE  NEGATIVE  URINALYSIS, ROUTINE W REFLEX MICROSCOPIC      Result Value Range   Color, Urine YELLOW  YELLOW   APPearance CLEAR  CLEAR   Specific Gravity, Urine 1.023  1.005 - 1.030   pH 5.5  5.0 - 8.0   Glucose, UA NEGATIVE  NEGATIVE mg/dL   Hgb urine dipstick SMALL (*) NEGATIVE   Bilirubin Urine NEGATIVE  NEGATIVE   Ketones, ur NEGATIVE  NEGATIVE mg/dL   Protein, ur NEGATIVE  NEGATIVE mg/dL   Urobilinogen, UA 0.2  0.0 - 1.0 mg/dL   Nitrite NEGATIVE  NEGATIVE   Leukocytes, UA NEGATIVE  NEGATIVE  URINE MICROSCOPIC-ADD ON      Result Value Range   Squamous Epithelial / LPF FEW (*) RARE   WBC, UA 0-2  <3 WBC/hpf   RBC / HPF 3-6  <3 RBC/hpf   Bacteria, UA MANY (*) RARE   Crystals CA OXALATE CRYSTALS (*) NEGATIVE  CBC WITH DIFFERENTIAL      Result Value Range   WBC 7.4  4.0 - 10.5 K/uL   RBC 4.57  3.87 - 5.11 MIL/uL   Hemoglobin 14.0  12.0 - 15.0 g/dL   HCT 16.1  09.6 - 04.5 %   MCV 88.2  78.0 - 100.0 fL   MCH 30.6  26.0 - 34.0 pg   MCHC 34.7  30.0 - 36.0 g/dL   RDW 40.9  81.1 - 91.4 %   Platelets 338  150 - 400 K/uL   Neutrophils Relative 64  43 - 77 %  Neutro Abs 4.8  1.7 - 7.7 K/uL   Lymphocytes Relative 26  12 - 46 %   Lymphs Abs 2.0  0.7 - 4.0 K/uL   Monocytes Relative 8  3 - 12 %   Monocytes Absolute 0.6  0.1 - 1.0  K/uL   Eosinophils Relative 1  0 - 5 %   Eosinophils Absolute 0.1  0.0 - 0.7 K/uL   Basophils Relative 0  0 - 1 %   Basophils Absolute 0.0  0.0 - 0.1 K/uL  COMPREHENSIVE METABOLIC PANEL      Result Value Range   Sodium 139  135 - 145 mEq/L   Potassium 3.9  3.5 - 5.1 mEq/L   Chloride 103  96 - 112 mEq/L   CO2 23  19 - 32 mEq/L   Glucose, Bld 97  70 - 99 mg/dL   BUN 8  6 - 23 mg/dL   Creatinine, Ser 4.09  0.50 - 1.10 mg/dL   Calcium 9.8  8.4 - 81.1 mg/dL   Total Protein 8.1  6.0 - 8.3 g/dL   Albumin 4.3  3.5 - 5.2 g/dL   AST 17  0 - 37 U/L   ALT 11  0 - 35 U/L   Alkaline Phosphatase 87  39 - 117 U/L   Total Bilirubin 0.3  0.3 - 1.2 mg/dL   GFR calc non Af Amer >90  >90 mL/min   GFR calc Af Amer >90  >90 mL/min     1. Urinary tract infection   2. Abdominal pain   3. Nausea & vomiting       MDM  Persistent abdominal pain, nausea, vomiting, diarrhea. Old records are reviewed and urinalysis obtained 7 days ago and 4 days ago both showed bacteria but were not cultured. Her laboratory work was otherwise unremarkable and CT scan showed nephrolithiasis but no ureterolithiasis. Urine will be sent for culture today and anticipate switching her antibiotic from cephalexin to trimethoprim-sulfamethoxazole.  Workup is significant only for persistent bacteria. She is discharged with prescription for trimethoprim-sulfamethoxazole and she is to continue taking metoclopramide as needed for nausea. She is advised she may need referral to gastroenterology if GI symptoms persist.  Dione Booze, MD 01/19/13 1226

## 2013-01-20 LAB — URINE CULTURE

## 2013-02-05 ENCOUNTER — Emergency Department (HOSPITAL_BASED_OUTPATIENT_CLINIC_OR_DEPARTMENT_OTHER)
Admission: EM | Admit: 2013-02-05 | Discharge: 2013-02-05 | Discharge status: Against Medical Advice | Payer: Medicaid Other | Attending: Emergency Medicine | Admitting: Emergency Medicine

## 2013-02-05 ENCOUNTER — Encounter (HOSPITAL_BASED_OUTPATIENT_CLINIC_OR_DEPARTMENT_OTHER): Payer: Self-pay | Admitting: *Deleted

## 2013-02-05 DIAGNOSIS — R112 Nausea with vomiting, unspecified: Secondary | ICD-10-CM | POA: Insufficient documentation

## 2013-02-05 DIAGNOSIS — I10 Essential (primary) hypertension: Secondary | ICD-10-CM | POA: Insufficient documentation

## 2013-02-05 NOTE — ED Notes (Signed)
Discussed with patient any referrals from [place] for continued speciality care of her recurrent health problems.  Husband, became very angry and cussed saying that they come here because this is where they always come for her abdominal pain.  Explained how a specialist may be able to help pt find out why she continues to have problems.  Pt was silent.  Husband again, became angry and stated that they will just leave.  Encouraged given stating that we will be glad to see the patient, and stated the importance of following up to get speciality care.  Pt and husband left.

## 2013-02-05 NOTE — ED Notes (Signed)
Woke up this morning with nausea and vomiting.  Seen here mulitple times for same.

## 2013-09-16 NOTE — L&D Delivery Note (Addendum)
1914: Call from nurse reporting patient with urge to push.  In room to assess, SVE: C/C/+2.  Patient instructed on breathing and pushing techniques.  FHR Cat II as variable and late decelerations noted.  Patient informed of fetal distress and instructed to push despite contractions.  Patient with good pushing efforts, with encouragement, and delivered as below.    Delivery Note At 7:40 PM, on Aug 11, 2014, a viable female "Merton Borderrthur" was delivered via Vaginal, Spontaneous Delivery (Presentation: Right Occiput Anterior).  After delivery of head, nuchal cord noted and infant somersaulted through without issues and shoulders delivered easily.  Infant without spontaneous cry and tone flaccid.  Infant immediately placed on mother's abdomen and nurse gave tactile stimulation.  Minimal respiratory efforts noted and cord clamped and cut.  Infant taken to warmer and given APGARs of 6, 7, 8.  Cord blood collected and pitocin started for active management as bleeding was noted to be constant.  Placenta delivered spontaneously and noted to be intact with 3VC upon inspection. Vaginal inspection revealed an intact perineum, but some membranes noted and removed with ring forceps.  Fundus firm, U/-2, and bleeding scant.  Mother hemodynamically stable and infant skin to skin prior to provider exit.  Mother expresses desire for PP BTL and declines circumcision for infant.  Infant weight at one hour of life: 7lbs 13.2 oz  Anesthesia: Epidural  Episiotomy: None Lacerations:  None Suture Repair: N/A Est. Blood Loss (mL):  350  Mom to postpartum.  Baby to Couplet care / Skin to Skin.  Tionne Carelli Larita FifeLYNN, CNM, MSN 08/11/2014, 8:30 PM

## 2013-10-10 IMAGING — CR DG ANKLE PORT 2V*R*
3 series · 3 of 3 positions shown · non-contrast
Comparison: None.

CLINICAL DATA: Fall down stairs

Three views right ankle, the patient was shielded

[view not recorded (1 of 3)]
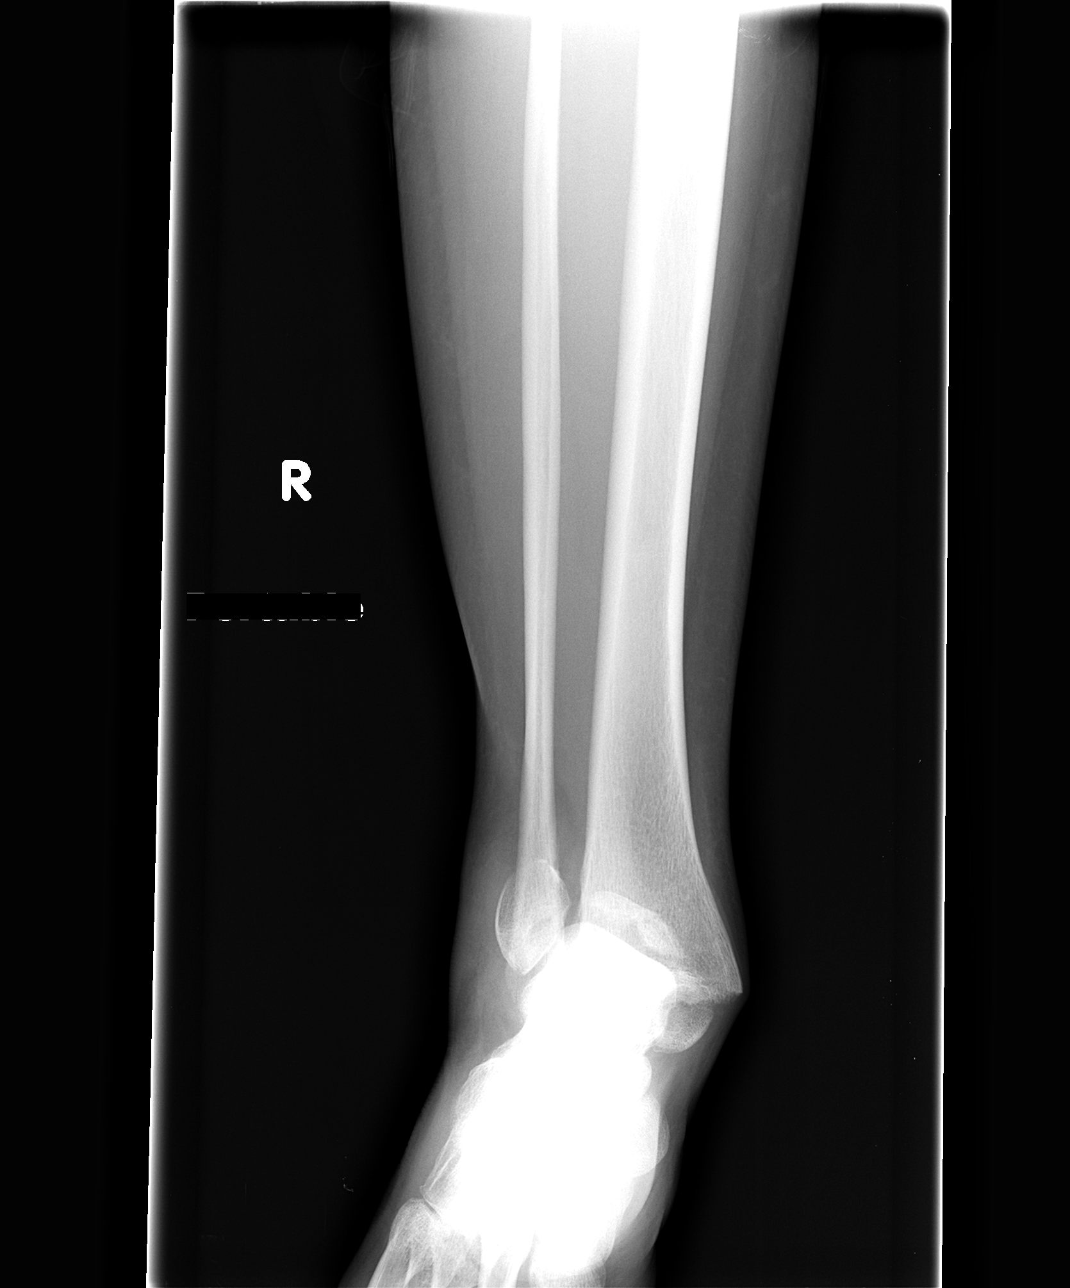

[view not recorded (2 of 3)]
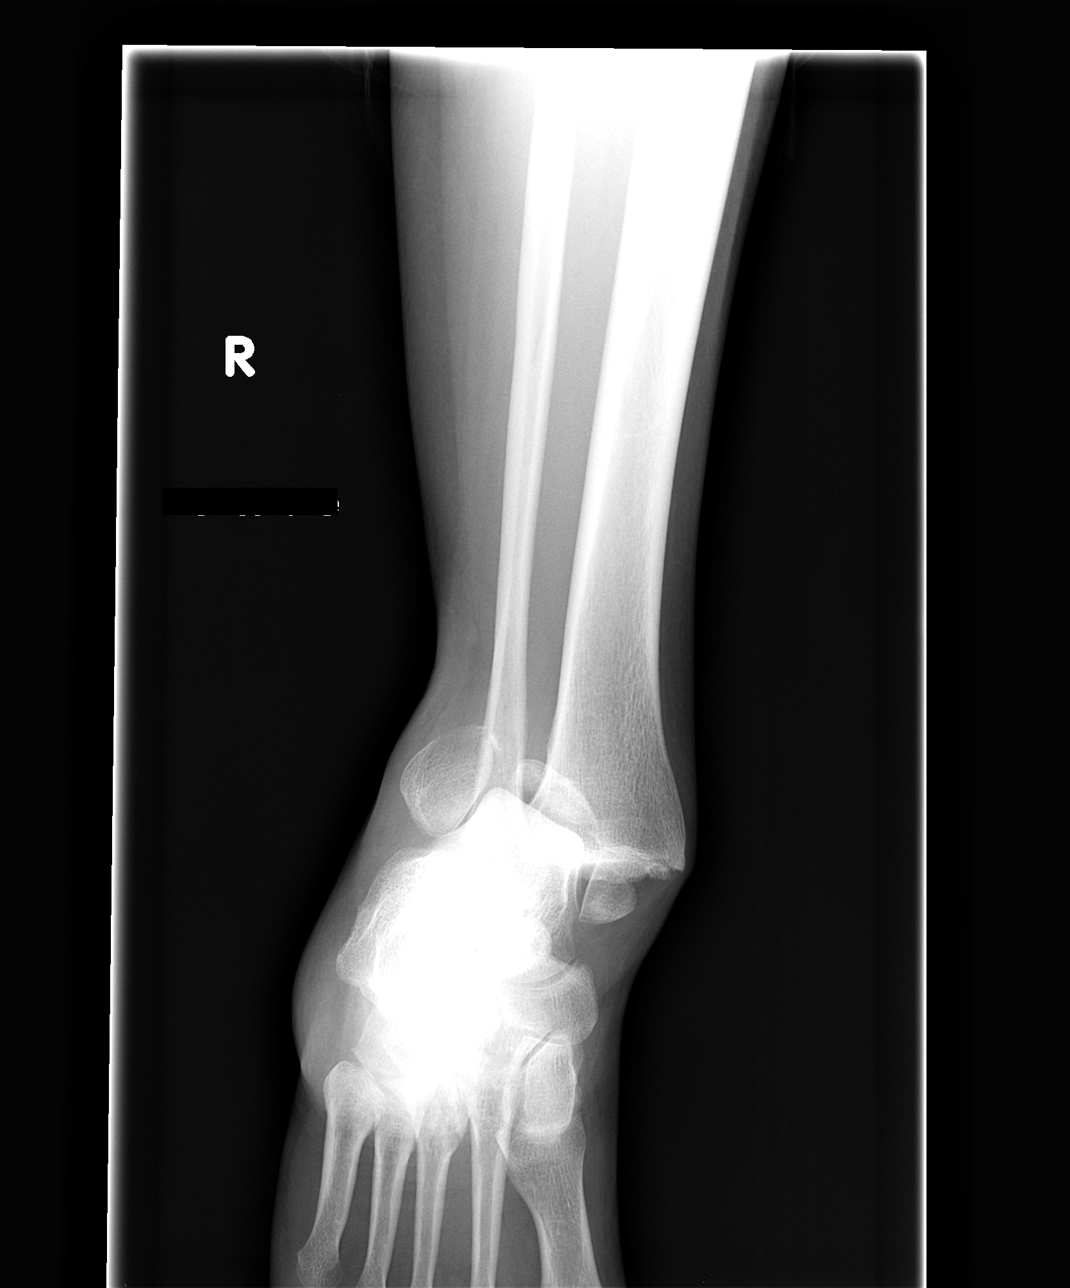

[view not recorded (3 of 3)]
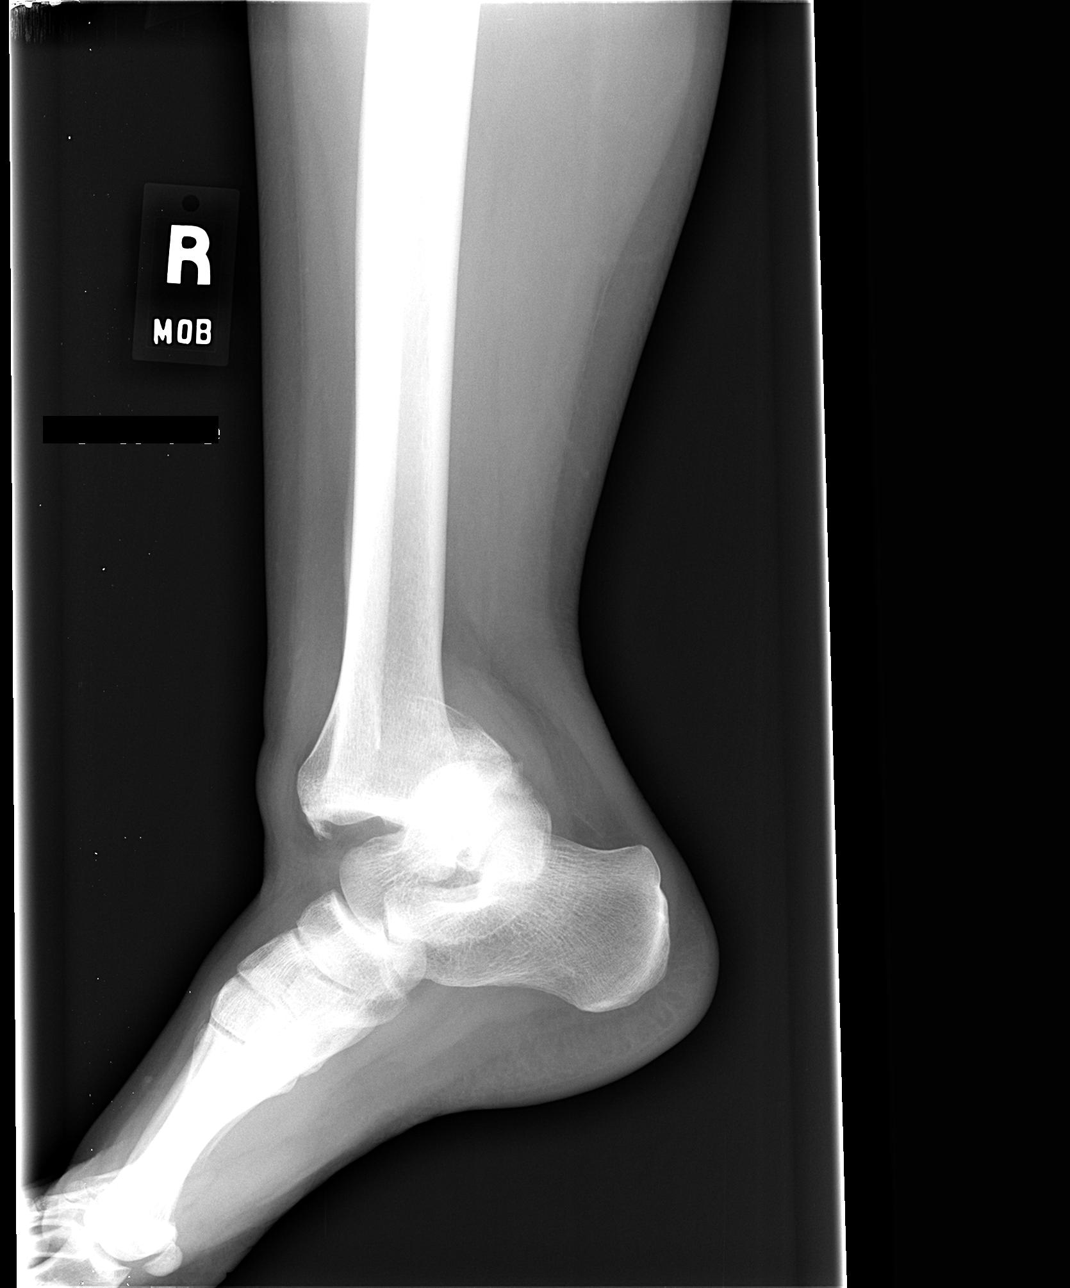

[3 of 3 positions shown; findings below may reference images not displayed]

FINDINGS: There is a fracture of the lateral malleolus, with distal
fracture fragment  completely displaced from the shaft of the
fibula in a lateral direction.  There is a fracture of the medial
malleolus, which is severely displaced laterally and which is
rotated.  The lateral image demonstrates fracture of the posterior
malleolus, as well as anterior translocation of the tibia and
fibula on the talus.  The mortise is significantly disrupted.  No
fracture of the dome of the talus is identified.
IMPRESSION: Severely displaced trimalleolar fracture.

## 2014-03-04 LAB — OB RESULTS CONSOLE ABO/RH: RH TYPE: POSITIVE

## 2014-03-04 LAB — OB RESULTS CONSOLE HIV ANTIBODY (ROUTINE TESTING): HIV: NONREACTIVE

## 2014-03-04 LAB — OB RESULTS CONSOLE ANTIBODY SCREEN: Antibody Screen: NEGATIVE

## 2014-03-04 LAB — OB RESULTS CONSOLE RPR: RPR: NONREACTIVE

## 2014-03-04 LAB — OB RESULTS CONSOLE HEPATITIS B SURFACE ANTIGEN: HEP B S AG: NEGATIVE

## 2014-03-04 LAB — OB RESULTS CONSOLE RUBELLA ANTIBODY, IGM: Rubella: NON-IMMUNE/NOT IMMUNE

## 2014-03-09 ENCOUNTER — Inpatient Hospital Stay (HOSPITAL_COMMUNITY)
Admission: AD | Admit: 2014-03-09 | Discharge: 2014-03-09 | Disposition: A | Discharge status: Home / Self Care | Payer: Medicaid Other | Source: Ambulatory Visit | Attending: Obstetrics and Gynecology | Admitting: Obstetrics and Gynecology

## 2014-03-09 ENCOUNTER — Inpatient Hospital Stay (HOSPITAL_COMMUNITY)
Admission: AD | Admit: 2014-03-09 | Discharge: 2014-03-12 | DRG: 781 | Disposition: A | Discharge status: Home / Self Care | Payer: Medicaid Other | Source: Ambulatory Visit | Attending: Obstetrics and Gynecology | Admitting: Obstetrics and Gynecology

## 2014-03-09 ENCOUNTER — Encounter (HOSPITAL_COMMUNITY): Payer: Self-pay | Admitting: *Deleted

## 2014-03-09 DIAGNOSIS — Z87442 Personal history of urinary calculi: Secondary | ICD-10-CM | POA: Diagnosis not present

## 2014-03-09 DIAGNOSIS — F419 Anxiety disorder, unspecified: Secondary | ICD-10-CM

## 2014-03-09 DIAGNOSIS — Z8759 Personal history of other complications of pregnancy, childbirth and the puerperium: Secondary | ICD-10-CM

## 2014-03-09 DIAGNOSIS — O98519 Other viral diseases complicating pregnancy, unspecified trimester: Secondary | ICD-10-CM | POA: Diagnosis present

## 2014-03-09 DIAGNOSIS — O10019 Pre-existing essential hypertension complicating pregnancy, unspecified trimester: Secondary | ICD-10-CM | POA: Diagnosis present

## 2014-03-09 DIAGNOSIS — F329 Major depressive disorder, single episode, unspecified: Secondary | ICD-10-CM | POA: Diagnosis present

## 2014-03-09 DIAGNOSIS — T368X5A Adverse effect of other systemic antibiotics, initial encounter: Secondary | ICD-10-CM

## 2014-03-09 DIAGNOSIS — O09899 Supervision of other high risk pregnancies, unspecified trimester: Secondary | ICD-10-CM

## 2014-03-09 DIAGNOSIS — E876 Hypokalemia: Secondary | ICD-10-CM | POA: Diagnosis present

## 2014-03-09 DIAGNOSIS — B069 Rubella without complication: Secondary | ICD-10-CM | POA: Diagnosis present

## 2014-03-09 DIAGNOSIS — Z8659 Personal history of other mental and behavioral disorders: Secondary | ICD-10-CM

## 2014-03-09 DIAGNOSIS — Z3490 Encounter for supervision of normal pregnancy, unspecified, unspecified trimester: Secondary | ICD-10-CM

## 2014-03-09 DIAGNOSIS — O210 Mild hyperemesis gravidarum: Secondary | ICD-10-CM | POA: Insufficient documentation

## 2014-03-09 DIAGNOSIS — Z91040 Latex allergy status: Secondary | ICD-10-CM | POA: Diagnosis present

## 2014-03-09 DIAGNOSIS — O219 Vomiting of pregnancy, unspecified: Secondary | ICD-10-CM | POA: Diagnosis present

## 2014-03-09 DIAGNOSIS — Z283 Underimmunization status: Secondary | ICD-10-CM

## 2014-03-09 DIAGNOSIS — F341 Dysthymic disorder: Secondary | ICD-10-CM | POA: Diagnosis present

## 2014-03-09 DIAGNOSIS — F32A Depression, unspecified: Secondary | ICD-10-CM | POA: Diagnosis present

## 2014-03-09 DIAGNOSIS — Z87898 Personal history of other specified conditions: Secondary | ICD-10-CM | POA: Diagnosis not present

## 2014-03-09 DIAGNOSIS — O9989 Other specified diseases and conditions complicating pregnancy, childbirth and the puerperium: Secondary | ICD-10-CM

## 2014-03-09 DIAGNOSIS — O99891 Other specified diseases and conditions complicating pregnancy: Secondary | ICD-10-CM

## 2014-03-09 HISTORY — DX: Vomiting, unspecified: R11.10

## 2014-03-09 LAB — URINALYSIS, ROUTINE W REFLEX MICROSCOPIC
Bilirubin Urine: NEGATIVE
Glucose, UA: NEGATIVE mg/dL
Ketones, ur: >80 mg/dL — AB
Leukocytes, UA: NEGATIVE
NITRITE: NEGATIVE
Protein, ur: NEGATIVE mg/dL
SPECIFIC GRAVITY, URINE: 1.02 (ref 1.005–1.030)
Urobilinogen, UA: 0.2 mg/dL (ref 0.0–1.0)
pH: 6 (ref 5.0–8.0)

## 2014-03-09 LAB — URINE MICROSCOPIC-ADD ON

## 2014-03-09 MED ORDER — HYDROXYZINE HCL 50 MG/ML IM SOLN: 50.0000 mg | Freq: Once | INTRAMUSCULAR | Status: DC

## 2014-03-09 MED ORDER — LACTATED RINGERS IV BOLUS (SEPSIS)
500.0000 mL | Freq: Once | INTRAVENOUS | Status: AC
  Administered 2014-03-09: 15:00:00 via INTRAVENOUS

## 2014-03-09 MED ORDER — ONDANSETRON 8 MG/NS 50 ML IVPB
8.0000 mg | Freq: Once | INTRAVENOUS | Status: AC
  Administered 2014-03-09: 8 mg via INTRAVENOUS
  Filled 2014-03-09: qty 8

## 2014-03-09 MED ORDER — METOCLOPRAMIDE HCL 10 MG PO TABS: 10.0000 mg | ORAL_TABLET | Freq: Four times a day (QID) | ORAL | Status: DC | PRN

## 2014-03-09 MED ORDER — ONDANSETRON 8 MG PO TBDP: 8.0000 mg | ORAL_TABLET | Freq: Three times a day (TID) | ORAL | Status: AC | PRN

## 2014-03-09 MED ORDER — DIPHENHYDRAMINE HCL 50 MG/ML IJ SOLN
25.0000 mg | Freq: Once | INTRAMUSCULAR | Status: AC
  Administered 2014-03-09: 25 mg via INTRAVENOUS
  Filled 2014-03-09: qty 1

## 2014-03-09 MED ORDER — METOCLOPRAMIDE HCL 5 MG/ML IJ SOLN
10.0000 mg | Freq: Once | INTRAMUSCULAR | Status: AC
Start: 2014-03-09 — End: 2014-03-09
  Administered 2014-03-09: 10 mg via INTRAVENOUS
  Filled 2014-03-09: qty 2

## 2014-03-09 NOTE — MAU Note (Signed)
Vomiting for last 3 days, unable to hold down food or liquids.  Was seen in ED @ [facility] during the night, was given IV fluids, prescribed potassium tablets which she was unable to hold down.

## 2014-03-09 NOTE — Discharge Instructions (Signed)
Hyperemesis Gravidarum °Hyperemesis gravidarum is a severe form of nausea and vomiting that happens during pregnancy. Hyperemesis is worse than morning sickness. It may cause you to have nausea or vomiting all day for many days. It may keep you from eating and drinking enough food and liquids. Hyperemesis usually occurs during the first half (the first 20 weeks) of pregnancy. It often goes away once a woman is in her second half of pregnancy. However, sometimes hyperemesis continues through an entire pregnancy.  °CAUSES  °The cause of this condition is not completely known but is thought to be related to changes in the body's hormones when pregnant. It could be from the high level of the pregnancy hormone or an increase in estrogen in the body.  °SIGNS AND SYMPTOMS  °· Severe nausea and vomiting. °· Nausea that does not go away. °· Vomiting that does not allow you to keep any food down. °· Weight loss and body fluid loss (dehydration). °· Having no desire to eat or not liking food you have previously enjoyed. °DIAGNOSIS  °Your health care provider will do a physical exam and ask you about your symptoms. He or she may also order blood tests and urine tests to make sure something else is not causing the problem.  °TREATMENT  °You may only need medicine to control the problem. If medicines do not control the nausea and vomiting, you will be treated in the hospital to prevent dehydration, increased acid in the blood (acidosis), weight loss, and changes in the electrolytes in your body that may harm the unborn baby (fetus). You may need IV fluids.  °HOME CARE INSTRUCTIONS  °· Only take over-the-counter or prescription medicines as directed by your health care provider. °· Try eating a couple of dry crackers or toast in the morning before getting out of bed. °· Avoid foods and smells that upset your stomach. °· Avoid fatty and spicy foods. °· Eat 5-6 small meals a day. °· Do not drink when eating meals. Drink between  meals. °· For snacks, eat high-protein foods, such as cheese. °· Eat or suck on things that have ginger in them. Ginger helps nausea. °· Avoid food preparation. The smell of food can spoil your appetite. °· Avoid iron pills and iron in your multivitamins until after 3-4 months of being pregnant. However, consult with your health care provider before stopping any prescribed iron pills. °SEEK MEDICAL CARE IF:  °· Your abdominal pain increases. °· You have a severe headache. °· You have vision problems. °· You are losing weight. °SEEK IMMEDIATE MEDICAL CARE IF:  °· You are unable to keep fluids down. °· You vomit blood. °· You have constant nausea and vomiting. °· You have excessive weakness. °· You have extreme thirst. °· You have dizziness or fainting. °· You have a fever or persistent symptoms for more than 2-3 days. °· You have a fever and your symptoms suddenly get worse. °MAKE SURE YOU:  °· Understand these instructions. °· Will watch your condition. °· Will get help right away if you are not doing well or get worse. °Document Released: 09/02/2005 Document Revised: 06/23/2013 Document Reviewed: 04/14/2013 °[employer]® Patient Information ©2015 [employer]. This information is not intended to replace advice given to you by your health care provider. Make sure you discuss any questions you have with your health care provider. ° °

## 2014-03-09 NOTE — MAU Provider Note (Signed)
History   Patient is a 33y.o. G2P1001 at 17wks who presents for nausea and vomiting x3 days.  [facility] and was given zofran which seemed to work.  However, patient states that once home the PO zofran was not as effective.  Patient reports a history of GI issues including frequent kidney stones and exploratory endoscopic surgeries for bowel related issues.  Patient also reports hyperemesis with previous pregnancy and n/v outside of pregnancy due to GI issues.  Patient reports that she does well with IV medications.  Patient reports active fetus and denies LoF, VB, and cramping/contractions.  Patient also denies diarrhea, constipation, and fever.   Patient Active Problem List   Diagnosis Date Noted  . Ankle fracture, right 05/25/2012  . Fracture of ankle, trimalleolar, right, closed 05/09/2012  . Breast abscess 04/08/2011  . Hypertension 04/08/2011  . Mastitis, obstetric, postpartum condition 04/01/2011    Chief Complaint  Patient presents with  . Emesis During Pregnancy   HPI  OB History   Grav Para Term Preterm Abortions TAB SAB Ect Mult Living   2 1 1       1       Past Medical History  Diagnosis Date  . Hypertension   . Anxiety   . Depression     post partum  . Allergy   . Breast abscess   . Mastitis   . UJWJXBJY(782.9Headache(784.0)     Past Surgical History  Procedure Laterality Date  . Incise and drain abcess    . Mouth surgery    . Incision and drainage breast abscess    . Orif ankle fracture  05/10/2012    Procedure: OPEN REDUCTION INTERNAL FIXATION (ORIF) ANKLE FRACTURE;  Surgeon: Vickki HearingStanley E Harrison, MD;  Location: [facility];  Service: Orthopedics;  Laterality: Right;    Family History  Problem Relation Age of Onset  . Cancer Mother     breast  . Alcohol abuse Sister     History  Substance Use Topics  . Smoking status: Never Smoker   . Smokeless tobacco: Never Used  . Alcohol Use: No    Allergies:  Allergies  Allergen  Reactions  . Morphine And Related Itching and Other (See Comments)    redness  . Promethazine Hcl Other (See Comments)    REACTION: psych effects  . Vancomycin Itching and Swelling    Redness.   . Adhesive [Tape] Rash    Redness.   . Latex Rash    Prescriptions prior to admission  Medication Sig Dispense Refill  . busPIRone (BUSPAR) 15 MG tablet Take 15 mg by mouth 4 (four) times daily.       . mirtazapine (REMERON) 15 MG tablet Take 15 mg by mouth at bedtime.      Marland Kitchen. PARoxetine (PAXIL) 30 MG tablet Take 30 mg by mouth at bedtime.      . sertraline (ZOLOFT) 100 MG tablet Take 100 mg by mouth daily.        Marland Kitchen. zolpidem (AMBIEN) 10 MG tablet Take 10 mg by mouth at bedtime.        ROS  See HPI Above Physical Exam   Blood pressure 155/81, pulse 115, temperature 97.7 F (36.5 C), temperature source Oral, resp. rate 22.  Physical Exam General: Actively vomiting into emesis bag. Appears pale, diaphoretic, and distressed/disheveled FHR 170 via doppler Abdomen: Gravid, soft, nt, appears appropriate gestational age  ED Course  Assessment: IUP at 17wks Nausea & Vomiting H/O  GI Disorder  Plan: -Labs: UA -Start IV with LR bolus followed by continuous infusion -Reglan IV  Follow Up (1645) -Patient reports feeling better after IV fluids and medication -Discussed with Dr. Audree CamelN. Dillard who advised as below -Give PO challenge including crackers and ginger ale -Reassess patient for N/V s/p intake  Follow Up (1845) -Patient able to consume foods with minimal nausea -Appears well, no distress noted -Discussed plan to discharge to home with PO reglan and Zofran ODT -Instructions given -Patient express concern and frustration regarding inability to eat and ongoing n/v -Reassurances given as patient admits to anxiety and feels n/v is psychological -Patient to receive IV zofran and benadryl for sleep -Follow up in the office on your next OB visit.  -Call if you have any questions or  concerns prior to your next visit.  -Discharge to home--stable  [facility] Medical CenterEMLY, JESSICA LYNN CNM, MSN 03/09/2014 2:55 PM

## 2014-03-10 ENCOUNTER — Encounter (HOSPITAL_COMMUNITY): Payer: Self-pay | Admitting: *Deleted

## 2014-03-10 DIAGNOSIS — O9989 Other specified diseases and conditions complicating pregnancy, childbirth and the puerperium: Secondary | ICD-10-CM

## 2014-03-10 DIAGNOSIS — Z87442 Personal history of urinary calculi: Secondary | ICD-10-CM | POA: Diagnosis not present

## 2014-03-10 DIAGNOSIS — O09899 Supervision of other high risk pregnancies, unspecified trimester: Secondary | ICD-10-CM

## 2014-03-10 DIAGNOSIS — Z87898 Personal history of other specified conditions: Secondary | ICD-10-CM | POA: Diagnosis not present

## 2014-03-10 DIAGNOSIS — Z8659 Personal history of other mental and behavioral disorders: Secondary | ICD-10-CM

## 2014-03-10 DIAGNOSIS — F329 Major depressive disorder, single episode, unspecified: Secondary | ICD-10-CM | POA: Diagnosis present

## 2014-03-10 DIAGNOSIS — O219 Vomiting of pregnancy, unspecified: Secondary | ICD-10-CM | POA: Diagnosis present

## 2014-03-10 DIAGNOSIS — F419 Anxiety disorder, unspecified: Secondary | ICD-10-CM

## 2014-03-10 DIAGNOSIS — O99891 Other specified diseases and conditions complicating pregnancy: Secondary | ICD-10-CM

## 2014-03-10 DIAGNOSIS — Z91040 Latex allergy status: Secondary | ICD-10-CM | POA: Diagnosis present

## 2014-03-10 DIAGNOSIS — T368X5A Adverse effect of other systemic antibiotics, initial encounter: Secondary | ICD-10-CM

## 2014-03-10 DIAGNOSIS — Z283 Underimmunization status: Secondary | ICD-10-CM

## 2014-03-10 DIAGNOSIS — Z8759 Personal history of other complications of pregnancy, childbirth and the puerperium: Secondary | ICD-10-CM

## 2014-03-10 DIAGNOSIS — F32A Depression, unspecified: Secondary | ICD-10-CM | POA: Diagnosis present

## 2014-03-10 LAB — CBC WITH DIFFERENTIAL/PLATELET
Basophils Absolute: 0 10*3/uL (ref 0.0–0.1)
Basophils Relative: 0 % (ref 0–1)
Eosinophils Absolute: 0 10*3/uL (ref 0.0–0.7)
Eosinophils Relative: 0 % (ref 0–5)
HEMATOCRIT: 33.2 % — AB (ref 36.0–46.0)
HEMOGLOBIN: 11.8 g/dL — AB (ref 12.0–15.0)
LYMPHS PCT: 14 % (ref 12–46)
Lymphs Abs: 2.3 10*3/uL (ref 0.7–4.0)
MCH: 31.5 pg (ref 26.0–34.0)
MCHC: 35.5 g/dL (ref 30.0–36.0)
MCV: 88.5 fL (ref 78.0–100.0)
MONOS PCT: 4 % (ref 3–12)
Monocytes Absolute: 0.7 10*3/uL (ref 0.1–1.0)
NEUTROS ABS: 13.1 10*3/uL — AB (ref 1.7–7.7)
Neutrophils Relative %: 82 % — ABNORMAL HIGH (ref 43–77)
Platelets: 295 10*3/uL (ref 150–400)
RBC: 3.75 MIL/uL — AB (ref 3.87–5.11)
RDW: 13 % (ref 11.5–15.5)
WBC: 16.2 10*3/uL — AB (ref 4.0–10.5)

## 2014-03-10 LAB — URINALYSIS, ROUTINE W REFLEX MICROSCOPIC
Bilirubin Urine: NEGATIVE
Glucose, UA: NEGATIVE mg/dL
LEUKOCYTES UA: NEGATIVE
NITRITE: NEGATIVE
Protein, ur: NEGATIVE mg/dL
SPECIFIC GRAVITY, URINE: 1.015 (ref 1.005–1.030)
UROBILINOGEN UA: 0.2 mg/dL (ref 0.0–1.0)
pH: 7 (ref 5.0–8.0)

## 2014-03-10 LAB — URINE MICROSCOPIC-ADD ON

## 2014-03-10 LAB — COMPREHENSIVE METABOLIC PANEL
ALBUMIN: 3.4 g/dL — AB (ref 3.5–5.2)
ALT: 9 U/L (ref 0–35)
AST: 13 U/L (ref 0–37)
Alkaline Phosphatase: 59 U/L (ref 39–117)
BUN: 5 mg/dL — ABNORMAL LOW (ref 6–23)
CALCIUM: 9.1 mg/dL (ref 8.4–10.5)
CO2: 21 meq/L (ref 19–32)
CREATININE: 0.48 mg/dL — AB (ref 0.50–1.10)
Chloride: 95 mEq/L — ABNORMAL LOW (ref 96–112)
GFR calc Af Amer: >90 mL/min (ref >90)
GFR calc non Af Amer: >90 mL/min (ref >90)
Glucose, Bld: 166 mg/dL — ABNORMAL HIGH (ref 70–99)
Potassium: 3 mEq/L — ABNORMAL LOW (ref 3.7–5.3)
Sodium: 132 mEq/L — ABNORMAL LOW (ref 137–147)
TOTAL PROTEIN: 6.8 g/dL (ref 6.0–8.3)
Total Bilirubin: 0.3 mg/dL (ref 0.3–1.2)

## 2014-03-10 LAB — LIPASE, BLOOD: LIPASE: 19 U/L (ref 11–59)

## 2014-03-10 LAB — AMYLASE: Amylase: 45 U/L (ref 0–105)

## 2014-03-10 LAB — TSH: TSH: 1.06 u[IU]/mL (ref 0.350–4.500)

## 2014-03-10 MED ORDER — ONDANSETRON HCL 4 MG/2ML IJ SOLN
4.0000 mg | Freq: Four times a day (QID) | INTRAMUSCULAR | Status: DC
  Administered 2014-03-10 – 2014-03-11 (×7): 4 mg via INTRAVENOUS
  Filled 2014-03-10 (×7): qty 2

## 2014-03-10 MED ORDER — PANTOPRAZOLE SODIUM 40 MG IV SOLR
40.0000 mg | Freq: Every day | INTRAVENOUS | Status: DC
  Administered 2014-03-10 – 2014-03-11 (×3): 40 mg via INTRAVENOUS
  Filled 2014-03-10 (×3): qty 40

## 2014-03-10 MED ORDER — SERTRALINE HCL 100 MG PO TABS
100.0000 mg | ORAL_TABLET | Freq: Every day | ORAL | Status: DC
  Administered 2014-03-10 – 2014-03-11 (×2): 100 mg via ORAL
  Filled 2014-03-10 (×2): qty 1

## 2014-03-10 MED ORDER — METOCLOPRAMIDE HCL 5 MG/ML IJ SOLN
10.0000 mg | Freq: Four times a day (QID) | INTRAMUSCULAR | Status: DC
  Administered 2014-03-10 – 2014-03-12 (×7): 10 mg via INTRAVENOUS
  Filled 2014-03-10 (×7): qty 2

## 2014-03-10 MED ORDER — DEXTROSE 5 % AND 0.45 % NACL IV BOLUS
500.0000 mL | Freq: Once | INTRAVENOUS | Status: AC
  Administered 2014-03-10: 500 mL via INTRAVENOUS

## 2014-03-10 MED ORDER — BUSPIRONE HCL 10 MG PO TABS
15.0000 mg | ORAL_TABLET | Freq: Three times a day (TID) | ORAL | Status: DC
  Administered 2014-03-10 – 2014-03-12 (×6): 15 mg via ORAL
  Filled 2014-03-10 (×10): qty 1.5

## 2014-03-10 MED ORDER — ONDANSETRON HCL 4 MG PO TABS
4.0000 mg | ORAL_TABLET | Freq: Four times a day (QID) | ORAL | Status: DC
  Administered 2014-03-11 – 2014-03-12 (×4): 4 mg via ORAL
  Filled 2014-03-10 (×5): qty 1

## 2014-03-10 MED ORDER — MIRTAZAPINE 30 MG PO TABS
15.0000 mg | ORAL_TABLET | Freq: Every day | ORAL | Status: DC
  Administered 2014-03-10: 22:00:00 via ORAL
  Administered 2014-03-11: 15 mg via ORAL
  Filled 2014-03-10 (×2): qty 0.5

## 2014-03-10 MED ORDER — ZOLPIDEM TARTRATE 5 MG PO TABS
5.0000 mg | ORAL_TABLET | Freq: Every evening | ORAL | Status: DC | PRN
  Administered 2014-03-10 – 2014-03-11 (×2): 5 mg via ORAL
  Filled 2014-03-10 (×2): qty 1

## 2014-03-10 MED ORDER — POTASSIUM CHLORIDE 10 MEQ/100ML IV SOLN
10.0000 meq | INTRAVENOUS | Status: AC
  Administered 2014-03-10 (×2): 10 meq via INTRAVENOUS
  Filled 2014-03-10 (×3): qty 100

## 2014-03-10 MED ORDER — DEXTROSE-NACL 5-0.45 % IV SOLN
INTRAVENOUS | Status: DC
  Administered 2014-03-10 – 2014-03-11 (×8): via INTRAVENOUS

## 2014-03-10 MED ORDER — BUTORPHANOL TARTRATE 1 MG/ML IJ SOLN
1.0000 mg | INTRAMUSCULAR | Status: DC | PRN
  Administered 2014-03-10 – 2014-03-11 (×5): 1 mg via INTRAVENOUS
  Filled 2014-03-10 (×5): qty 1

## 2014-03-10 NOTE — Progress Notes (Signed)
Hospital day # 1 pregnancy at 17 1/7 weeks.  S:  Still feeling very nauseated, no vomiting since arrival.  NPO except ice chips.  Abdomen feels sore from vomiting.  Has received Stadol x 2 since admission.       O: BP 100/67  Pulse 100  Temp(Src) 98 F (36.7 C) (Oral)  Resp 20  Ht 5\' 1"  (1.549 m)  Wt 119 lb (53.978 kg)  BMI 22.50 kg/m2  SpO2 99%      FHR 161 by dopper            Uterus consistent with 17 weeks and non-tender      Extremities: no significant edema and no signs of DVT          Labs:   Results for orders placed during the hospital encounter of 03/09/14 (from the past 24 hour(s))  URINALYSIS, ROUTINE W REFLEX MICROSCOPIC     Status: Abnormal   Collection Time    03/10/14 12:15 AM      Result Value Ref Range   Color, Urine YELLOW  YELLOW   APPearance CLEAR  CLEAR   Specific Gravity, Urine 1.015  1.005 - 1.030   pH 7.0  5.0 - 8.0   Glucose, UA NEGATIVE  NEGATIVE mg/dL   Hgb urine dipstick TRACE (*) NEGATIVE   Bilirubin Urine NEGATIVE  NEGATIVE   Ketones, ur >80 (*) NEGATIVE mg/dL   Protein, ur NEGATIVE  NEGATIVE mg/dL   Urobilinogen, UA 0.2  0.0 - 1.0 mg/dL   Nitrite NEGATIVE  NEGATIVE   Leukocytes, UA NEGATIVE  NEGATIVE  URINE MICROSCOPIC-ADD ON     Status: None   Collection Time    03/10/14 12:15 AM      Result Value Ref Range   Squamous Epithelial / LPF RARE  RARE   WBC, UA 0-2  <3 WBC/hpf   RBC / HPF 0-2  <3 RBC/hpf   Bacteria, UA RARE  RARE  COMPREHENSIVE METABOLIC PANEL     Status: Abnormal   Collection Time    03/10/14  1:23 AM      Result Value Ref Range   Sodium 132 (*) 137 - 147 mEq/L   Potassium 3.0 (*) 3.7 - 5.3 mEq/L   Chloride 95 (*) 96 - 112 mEq/L   CO2 21  19 - 32 mEq/L   Glucose, Bld 166 (*) 70 - 99 mg/dL   BUN 5 (*) 6 - 23 mg/dL   Creatinine, Ser 1.610.48 (*) 0.50 - 1.10 mg/dL   Calcium 9.1  8.4 - 09.610.5 mg/dL   Total Protein 6.8  6.0 - 8.3 g/dL   Albumin 3.4 (*) 3.5 - 5.2 g/dL   AST 13  0 - 37 U/L   ALT 9  0 - 35 U/L   Alkaline  Phosphatase 59  39 - 117 U/L   Total Bilirubin 0.3  0.3 - 1.2 mg/dL   GFR calc non Af Amer >90  >90 mL/min   GFR calc Af Amer >90  >90 mL/min  CBC WITH DIFFERENTIAL     Status: Abnormal   Collection Time    03/10/14  1:23 AM      Result Value Ref Range   WBC 16.2 (*) 4.0 - 10.5 K/uL   RBC 3.75 (*) 3.87 - 5.11 MIL/uL   Hemoglobin 11.8 (*) 12.0 - 15.0 g/dL   HCT 04.533.2 (*) 40.936.0 - 81.146.0 %   MCV 88.5  78.0 - 100.0 fL   MCH 31.5  26.0 - 34.0  pg   MCHC 35.5  30.0 - 36.0 g/dL   RDW 16.113.0  09.611.5 - 04.515.5 %   Platelets 295  150 - 400 K/uL   Neutrophils Relative % 82 (*) 43 - 77 %   Neutro Abs 13.1 (*) 1.7 - 7.7 K/uL   Lymphocytes Relative 14  12 - 46 %   Lymphs Abs 2.3  0.7 - 4.0 K/uL   Monocytes Relative 4  3 - 12 %   Monocytes Absolute 0.7  0.1 - 1.0 K/uL   Eosinophils Relative 0  0 - 5 %   Eosinophils Absolute 0.0  0.0 - 0.7 K/uL   Basophils Relative 0  0 - 1 %   Basophils Absolute 0.0  0.0 - 0.1 K/uL  TSH     Status: None   Collection Time    03/10/14  1:23 AM      Result Value Ref Range   TSH 1.060  0.350 - 4.500 uIU/mL  AMYLASE     Status: None   Collection Time    03/10/14  1:23 AM      Result Value Ref Range   Amylase 45  0 - 105 U/L  LIPASE, BLOOD     Status: None   Collection Time    03/10/14  1:23 AM      Result Value Ref Range   Lipase 19  11 - 59 U/L          Meds:  . busPIRone  15 mg Oral TID PC & HS  . mirtazapine  15 mg Oral QHS  . ondansetron  4 mg Oral 4 times per day   Or  . ondansetron (ZOFRAN) IV  4 mg Intravenous 4 times per day  . pantoprazole (PROTONIX) IV  40 mg Intravenous QHS  . sertraline  100 mg Oral QHS    A: 2751w1d with N/V      Hypokalemia     Unchanged  P: Continue current plan of care--IV hydration, antiemetics, Protonix      Will consult regarding K+ replacement      MDs will follow  Nigel BridgemanLATHAM, VICKI CNM, [place] 03/10/2014 6:52 AM

## 2014-03-10 NOTE — Progress Notes (Signed)
Discussed w/ Dr. Normand Sloopillard.  Potassium IV ordered for K+ level 3.0.  Reglan 10 mg IV added to regimen.  Brittany Beltran, CNM, MS 03/10/14 @ 12:24 PM

## 2014-03-10 NOTE — H&P (Signed)
Brittany Beltran is a 33 y.o. female, G2P1001 at 4617 1/7 weeks by LMP of 11/10/13, presenting for persistent N/V--has been doing well during pregnancy, with minimal N/V until now.  No known viral or food exposures.  Was also [facility] on 6/23 for N/V and was given Zofran and sent home.  Presented to [facility] the afternoon of 6/24 with same c/o.  Received IV fluid bolus, Reglan IV, and was d/c'd home after tolerating crackers and po fluids.  Was upset to be going home, since she felt she was still having issues, and called later in the evening with report of recurrence of N/V. She was directly admitted due to hx of several evaluations in last 24 hours.  Reports abdomen is sore, but feels this is from persistent vomiting.  Denies leaking, bleeding, dysuria, fever, or any other sx.  Reports doing well emotionally--seeing consistent therapist and on Sertraline, Buspirone, and Remeron with benefit.  Patient Active Problem List   Diagnosis Date Noted  . Vomiting affecting pregnancy 03/10/2014  . H/O mastitis--severe, staph aureus-after last delivery 03/10/2014  . H/O postpartum depression, currently pregnant--severe, required hospitalization 03/10/2014  . History of gestational hypertension 03/10/2014  . History of kidney stones 03/10/2014  . Anxiety and depression--on Sertraline, buspirone and remeron 03/10/2014  . Rubella non-immune status, antepartum 03/10/2014  . Latex allergy 03/10/2014  . Vancomycin adverse reaction 03/10/2014    History of present pregnancy: Patient has NOB visit scheduled 03/12/15, with anatomy scan scheduled on 03/29/14.   Cultures negative on 6/19 Pap WNL 11/2011. NOB labs WNL, except rubella non-immune.  OB History   Grav Para Term Preterm Abortions TAB SAB Ect Mult Living   2 1 1       1      Past Medical History  Diagnosis Date  . Hypertension   . Anxiety   . Depression     post partum  . Allergy   . Breast abscess   . Mastitis   . Headache(784.0)   .  Hyperemesis    Past Surgical History  Procedure Laterality Date  . Incise and drain abcess    . Mouth surgery    . Incision and drainage breast abscess    . Orif ankle fracture  05/10/2012    Procedure: OPEN REDUCTION INTERNAL FIXATION (ORIF) ANKLE FRACTURE;  Surgeon: Vickki HearingStanley E Harrison, MD;  Location: [facility];  Service: Orthopedics;  Laterality: Right;   Family History: family history includes Alcohol abuse in her sister; Cancer in her mother.  Social History:  reports that she has never smoked. She has never used smokeless tobacco. She reports that she does not drink alcohol or use illicit drugs.  Patient is married to FOB, is a homemaker, has 4 years of college, and is Caucasian in ethnicity.    ROS:  N/V, abdominal soreness from vomiting.  Allergies  Allergen Reactions  . Morphine And Related Itching and Other (See Comments)    redness  . Promethazine Hcl Other (See Comments)    REACTION: psych effects  . Vancomycin Itching and Swelling    Redness.   . Adhesive [Tape] Rash    Redness.   . Latex Rash       Blood pressure 138/85, pulse 99, temperature 98.2 F (36.8 C), temperature source Oral, resp. rate 22, height 5\' 1"  (1.549 m), weight 119 lb (53.978 kg), SpO2 100.00%.  Chest clear Heart RRR without murmur Abd gravid, NT, FH 17 weeks Pelvic: Deferred Ext: WNL  FHR: 161 by doppler  Prenatal labs: Prenatal labs pending from 03/04/14 Pap:  09/2013 GC:  Negative 03/04/14 Chlamydia:  Negative 03/04/14 Genetic screenings:  Declined       Assessment/Plan: IUP at 17 1/7 weeks Severe N/V Hx anxiety/depression Latex allergy Hx severe pp depression  Plan: Admitted to 3rd floor per consult with Dr. Su Hiltoberts IV hydration Zofran CBC, diff, CMP, amylase, lipase, TSH UA NPO except ice chips at present Stadol for pain Continue home meds:  Sertraline, Buspirone, and Remeron  Meriah Shands, [place], [place] 03/10/2014, 12:50a

## 2014-03-10 NOTE — Progress Notes (Signed)
Hospital day # 1 pregnancy at 1218w1d--N/V.  S: Vomiting clear liquid as I entered room. Reports severe N/V entire 1st pregnancy through delivery. +fetal awareness      Perception of cramping: none      Vaginal bleeding: none       Vaginal discharge:  no significant change  O: BP 100/67  Pulse 100  Temp(Src) 98 F (36.7 C) (Oral)  Resp 20  Ht 5\' 1"  (1.549 m)  Wt 119 lb (53.978 kg)  BMI 22.50 kg/m2  SpO2 99%  Intake/Output  Report       06/23 0700 06/24 0659 06/24 0700 06/25 0659 06/25 0700 06/26 0659    I.V. (mL/kg)  902.2 (16.7)     IV Piggyback  500     Total Intake(mL/kg)  1402.2 (26)     Urine (mL/kg/hr)  500     Emesis/NG output  100     Total Output  600     Net  +802.2            Urine Occurrence  1 x     Approximately 75 cc clear emesis noted       Fetal Heart Rate A Mode Doppler filed at 03/10/2014 0115 Baseline Rate (A) 161 bpm filed at 03/10/2014 0115       Uterus gravid and non-tender      Extremities: no significant edema and no signs of DVT           Labs: None since admission        Meds:  Scheduled    Medication Dose/Rate, Route, Frequency Last Action    busPIRone (BUSPAR) tablet 15 mg 15 mg, PO, TID PC & HS Ordered    mirtazapine (REMERON) tablet 15 mg 15 mg, PO, QHS Ordered    ondansetron (ZOFRAN) injection 4 mg 4 mg, IV, Q6H Given: 06/25 0534    ondansetron (ZOFRAN) tablet 4 mg No Dose/Rate, PO, Q6H See Alternative: 06/25 0534    pantoprazole (PROTONIX) injection 40 mg 40 mg, IV, QHS Given: 06/25 0106    sertraline (ZOLOFT) tablet 100 mg 100 mg, PO, QHS Ordered        Continuous     Medication Dose/Rate, Route, Frequency Last Action     dextrose 5 %-0.45 % sodium chloride infusion 250 mL/hr, IV, Continuous New Bag/Given: 06/25 0732         PRN     Medication Dose/Rate, Route, Frequency Last Action     butorphanol (STADOL) injection 1 mg 1 mg, IV, Q3H PRN Given: 06/25 0534     zolpidem (AMBIEN) tablet 5 mg 5 mg, PO, QHS PRN Given: 06/25  0153      A: 4618w1d with N/V (vomited twice since admission)     Hypokalemia     Unchanged  P: Continue current plan of care      Upcoming tests/treatments:  None      Discuss K+ level of 3.0 w/ Dr. Boyce Mediciillard  WILLIAMS, [facility]IMBER[facility] CNM, MS 03/10/2014 10:09 AM

## 2014-03-11 MED ORDER — IBUPROFEN 600 MG PO TABS
600.0000 mg | ORAL_TABLET | Freq: Four times a day (QID) | ORAL | Status: DC | PRN
  Administered 2014-03-11 – 2014-03-12 (×3): 600 mg via ORAL
  Filled 2014-03-11 (×3): qty 1

## 2014-03-11 MED ORDER — LACTATED RINGERS IV SOLN
INTRAVENOUS | Status: DC
  Administered 2014-03-11: 17:00:00 via INTRAVENOUS

## 2014-03-11 NOTE — Progress Notes (Signed)
Hospital day # 2 pregnancy at 3072w2d--N/V.  S:  Feeling much better. Last emesis 0100 today. Has been taking po meds w/o event.      Perception of cramping: Yes, but has not needed anything for pain today      Vaginal bleeding: none       Vaginal discharge:  no significant change  O: BP 122/68  Pulse 76  Temp(Src) 98.7 F (37.1 C) (Oral)  Resp 18  Ht 5\' 1"  (1.549 m)  Wt 120 lb (54.432 kg)  BMI 22.69 kg/m2  SpO2 100%  busPIRone (BUSPAR) tablet 15 mg 15 mg, PO, TID PC & HS Given: 06/26 1517    metoCLOPramide (REGLAN) injection 10 mg 10 mg, IV, Q6H Given: 06/26 1212    mirtazapine (REMERON) tablet 15 mg No Dose/Rate, PO, QHS Given: 06/25 2151    ondansetron (ZOFRAN) injection 4 mg 4 mg, IV, Q6H Given: 06/26 1212    ondansetron (ZOFRAN) tablet 4 mg No Dose/Rate, PO, Q6H See Alternative: 06/26 1212    pantoprazole (PROTONIX) injection 40 mg 40 mg, IV, QHS Given: 06/25 2150    sertraline (ZOLOFT) tablet 100 mg 100 mg, PO, QHS Given: 06/25 2150        PRN     Medication Dose/Rate, Route, Frequency Last Action     zolpidem (AMBIEN) tablet 5 mg 5 mg, PO, QHS PRN Given: 06/25 0153                Uterus gravid and non-tender      Extremities: extremities normal, atraumatic, no cyanosis or edema          Labs: None in past 24 hrs        A: 5572w2d with N/V     gradually improving  P:  Advance diet to clears, regular as tolerated.       Ibuprofen prn pain/cramping       Anticipate d/c in a.m.        Sherre ScarletWILLIAMS, KIMBERLY CNM, MS 03/11/2014 3:07 PM

## 2014-03-12 MED ORDER — PANTOPRAZOLE SODIUM 40 MG PO TBEC: 40.0000 mg | DELAYED_RELEASE_TABLET | Freq: Every day | ORAL | Status: AC

## 2014-03-12 MED ORDER — ACETAMINOPHEN 325 MG PO TABS: 650.0000 mg | ORAL_TABLET | Freq: Four times a day (QID) | ORAL | Status: DC | PRN

## 2014-03-12 MED ORDER — METOCLOPRAMIDE HCL 10 MG PO TABS: 10.0000 mg | ORAL_TABLET | Freq: Four times a day (QID) | ORAL | Status: DC

## 2014-03-12 NOTE — Progress Notes (Signed)
Discharge instructions reviewed with patient and significant other.  Both state understanding of home care, medications, activity, signs/symptoms to report to MD and return MD office visit.  No home equipment needed.  Patients significant other will assist with care at home.  Patient ambulated for discharge in stable condition with staff without incident.

## 2014-03-12 NOTE — Discharge Instructions (Signed)

## 2014-03-12 NOTE — Discharge Summary (Signed)
Physician Discharge Summary  Patient ID: Brittany Beltran MRN: [id number] DOB/AGE: [birth year] 33 y.o.  Admit date: 03/09/2014 Discharge date: 03/12/2014  Admission Diagnoses: Severe N/V  Discharge Diagnoses:  Principal Problem:   Vomiting affecting pregnancy Active Problems:   H/O mastitis--severe, staph aureus-after last delivery   H/O postpartum depression, currently pregnant--severe, required hospitalization   History of gestational hypertension   History of kidney stones   Anxiety and depression--on Sertraline, buspirone and remeron   Rubella non-immune status, antepartum   Latex allergy   Vancomycin adverse reaction   Discharged Condition: good  Hospital Course: Pt admitted for severe N/V. She was given IVFs with Potassium Chloride, IV Reglan, IV Zofran, IV Protonix, IV Stadol, Remeron, Buspirone, Sertraline, Ambien and Ibuprofen. By discharge, she was tolerating a regular diet with no further episodes of vomiting.  Consults: None  Significant Diagnostic Studies: labs: CBC w/ diff, CMP, amylase, lipase, TSH, UA. Potassium was low at 3.0.  Recent Results (from the past 2160 hour(s))  URINALYSIS, ROUTINE W REFLEX MICROSCOPIC     Status: Abnormal   Collection Time    03/09/14  2:23 PM      Result Value Ref Range   Color, Urine YELLOW  YELLOW   APPearance CLEAR  CLEAR   Specific Gravity, Urine 1.020  1.005 - 1.030   pH 6.0  5.0 - 8.0   Glucose, UA NEGATIVE  NEGATIVE mg/dL   Hgb urine dipstick LARGE (*) NEGATIVE   Bilirubin Urine NEGATIVE  NEGATIVE   Ketones, ur >80 (*) NEGATIVE mg/dL   Protein, ur NEGATIVE  NEGATIVE mg/dL   Urobilinogen, UA 0.2  0.0 - 1.0 mg/dL   Nitrite NEGATIVE  NEGATIVE   Leukocytes, UA NEGATIVE  NEGATIVE  URINE MICROSCOPIC-ADD ON     Status: Abnormal   Collection Time    03/09/14  2:23 PM      Result Value Ref Range   Squamous Epithelial / LPF FEW (*) RARE   RBC / HPF 21-50  <3 RBC/hpf  URINALYSIS, ROUTINE W REFLEX MICROSCOPIC     Status:  Abnormal   Collection Time    03/10/14 12:15 AM      Result Value Ref Range   Color, Urine YELLOW  YELLOW   APPearance CLEAR  CLEAR   Specific Gravity, Urine 1.015  1.005 - 1.030   pH 7.0  5.0 - 8.0   Glucose, UA NEGATIVE  NEGATIVE mg/dL   Hgb urine dipstick TRACE (*) NEGATIVE   Bilirubin Urine NEGATIVE  NEGATIVE   Ketones, ur >80 (*) NEGATIVE mg/dL   Protein, ur NEGATIVE  NEGATIVE mg/dL   Urobilinogen, UA 0.2  0.0 - 1.0 mg/dL   Nitrite NEGATIVE  NEGATIVE   Leukocytes, UA NEGATIVE  NEGATIVE  URINE MICROSCOPIC-ADD ON     Status: None   Collection Time    03/10/14 12:15 AM      Result Value Ref Range   Squamous Epithelial / LPF RARE  RARE   WBC, UA 0-2  <3 WBC/hpf   RBC / HPF 0-2  <3 RBC/hpf   Bacteria, UA RARE  RARE  COMPREHENSIVE METABOLIC PANEL     Status: Abnormal   Collection Time    03/10/14  1:23 AM      Result Value Ref Range   Sodium 132 (*) 137 - 147 mEq/L   Potassium 3.0 (*) 3.7 - 5.3 mEq/L   Chloride 95 (*) 96 - 112 mEq/L   CO2 21  19 - 32 mEq/L   Glucose, Bld 166 (*)  70 - 99 mg/dL   BUN 5 (*) 6 - 23 mg/dL   Creatinine, Ser 0.48 (*) 0.50 - 1.10 mg/dL   Calcium 9.1  8.4 - 10.5 mg/dL   Total Protein 6.8  6.0 - 8.3 g/dL   Albumin 3.4 (*) 3.5 - 5.2 g/dL   AST 13  0 - 37 U/L   ALT 9  0 - 35 U/L   Alkaline Phosphatase 59  39 - 117 U/L   Total Bilirubin 0.3  0.3 - 1.2 mg/dL   GFR calc non Af Amer >90  >90 mL/min   GFR calc Af Amer >90  >90 mL/min   Comment: (NOTE)     The eGFR has been calculated using the CKD EPI equation.     This calculation has not been validated in all clinical situations.     eGFR's persistently <90 mL/min signify possible Chronic Kidney     Disease.  CBC WITH DIFFERENTIAL     Status: Abnormal   Collection Time    03/10/14  1:23 AM      Result Value Ref Range   WBC 16.2 (*) 4.0 - 10.5 K/uL   RBC 3.75 (*) 3.87 - 5.11 MIL/uL   Hemoglobin 11.8 (*) 12.0 - 15.0 g/dL   HCT 33.2 (*) 36.0 - 46.0 %   MCV 88.5  78.0 - 100.0 fL   MCH 31.5  26.0  - 34.0 pg   MCHC 35.5  30.0 - 36.0 g/dL   RDW 13.0  11.5 - 15.5 %   Platelets 295  150 - 400 K/uL   Neutrophils Relative % 82 (*) 43 - 77 %   Neutro Abs 13.1 (*) 1.7 - 7.7 K/uL   Lymphocytes Relative 14  12 - 46 %   Lymphs Abs 2.3  0.7 - 4.0 K/uL   Monocytes Relative 4  3 - 12 %   Monocytes Absolute 0.7  0.1 - 1.0 K/uL   Eosinophils Relative 0  0 - 5 %   Eosinophils Absolute 0.0  0.0 - 0.7 K/uL   Basophils Relative 0  0 - 1 %   Basophils Absolute 0.0  0.0 - 0.1 K/uL  TSH     Status: None   Collection Time    03/10/14  1:23 AM      Result Value Ref Range   TSH 1.060  0.350 - 4.500 uIU/mL   Comment: Performed at [facility]     Status: None   Collection Time    03/10/14  1:23 AM      Result Value Ref Range   Amylase 45  0 - 105 U/L   Comment: Performed at [facility], BLOOD     Status: None   Collection Time    03/10/14  1:23 AM      Result Value Ref Range   Lipase 19  11 - 59 U/L   Comment: Performed at [facility]    Treatments: IV hydration, Potassium IV, Zofran/Reglan IV and analgesia: Stadol and Ibuprofen  Discharge Exam: Blood pressure 147/88, pulse 85, temperature 97.8 F (36.6 C), temperature source Oral, resp. rate 18, height _0  (1.549 m), weight 116 lb (52.617 kg), SpO2 99.00%. General appearance: alert, no distress and well appearing Lungs: CTAB CV: RRR Abdomen - soft, NTND, no masses or organomegaly, gravid Pelvic - deferred Extremities - Homan's sign negative bilaterally FHR - 158  Disposition: 01-Home or Self Care     Medication List  acetaminophen 325 MG tablet  Commonly known as:  TYLENOL  Take 2 tablets (650 mg total) by mouth every 6 (six) hours as needed.     BUSPAR 15 MG tablet  Generic drug:  busPIRone  Take 15 mg by mouth every 6 (six) hours as needed (anxiety).     metoCLOPramide 10 MG tablet  Commonly known as:  REGLAN  Take 1 tablet (10 mg total) by mouth every 6 (six) hours as  needed for nausea.     mirtazapine 15 MG tablet  Commonly known as:  REMERON  Take 15 mg by mouth at bedtime.     ondansetron 8 MG disintegrating tablet  Commonly known as:  ZOFRAN ODT  Take 1 tablet (8 mg total) by mouth every 8 (eight) hours as needed for nausea or vomiting.     pantoprazole 40 MG tablet  Commonly known as:  PROTONIX  Take 1 tablet (40 mg total) by mouth daily.     PARoxetine 30 MG tablet  Commonly known as:  PAXIL  Take 30 mg by mouth at bedtime.     prenatal multivitamin Tabs tablet  Take 1 tablet by mouth daily at 12 noon.     ranitidine 150 MG tablet  Commonly known as:  ZANTAC  Take 150 mg by mouth daily.     sertraline 100 MG tablet  Commonly known as:  ZOLOFT  Take 100 mg by mouth daily.     zolpidem 10 MG tablet  Commonly known as:  AMBIEN  Take 10 mg by mouth at bedtime.           Follow-up Information   Follow up with [facility]. Schedule an appointment as soon as possible for a visit in 1 week. (Office will call patient with appointment day and time.)    Specialty:  Obstetrics and Gynecology   Contact information:   [address] [place] [address] [phone]    Late SAB precautions given  Signed: Laurey Morale 03/12/2014, 12:24 PM

## 2014-04-16 ENCOUNTER — Encounter (HOSPITAL_COMMUNITY): Payer: Self-pay | Admitting: *Deleted

## 2014-04-16 ENCOUNTER — Inpatient Hospital Stay (HOSPITAL_COMMUNITY)
Admission: AD | Admit: 2014-04-16 | Discharge: 2014-04-16 | Disposition: A | Discharge status: Home / Self Care | Payer: Medicaid Other | Source: Ambulatory Visit | Attending: Obstetrics and Gynecology | Admitting: Obstetrics and Gynecology

## 2014-04-16 ENCOUNTER — Inpatient Hospital Stay (HOSPITAL_COMMUNITY): Payer: Medicaid Other

## 2014-04-16 DIAGNOSIS — Z87442 Personal history of urinary calculi: Secondary | ICD-10-CM | POA: Insufficient documentation

## 2014-04-16 DIAGNOSIS — O139 Gestational [pregnancy-induced] hypertension without significant proteinuria, unspecified trimester: Secondary | ICD-10-CM | POA: Insufficient documentation

## 2014-04-16 DIAGNOSIS — Z91040 Latex allergy status: Secondary | ICD-10-CM | POA: Insufficient documentation

## 2014-04-16 DIAGNOSIS — O99891 Other specified diseases and conditions complicating pregnancy: Secondary | ICD-10-CM | POA: Insufficient documentation

## 2014-04-16 DIAGNOSIS — R1031 Right lower quadrant pain: Secondary | ICD-10-CM | POA: Insufficient documentation

## 2014-04-16 DIAGNOSIS — O9989 Other specified diseases and conditions complicating pregnancy, childbirth and the puerperium: Principal | ICD-10-CM

## 2014-04-16 LAB — OB RESULTS CONSOLE GC/CHLAMYDIA
Chlamydia: NEGATIVE
Gonorrhea: NEGATIVE

## 2014-04-16 LAB — COMPREHENSIVE METABOLIC PANEL
ALBUMIN: 3.2 g/dL — AB (ref 3.5–5.2)
ALT: 11 U/L (ref 0–35)
AST: 13 U/L (ref 0–37)
Alkaline Phosphatase: 67 U/L (ref 39–117)
Anion gap: 15 (ref 5–15)
BUN: 12 mg/dL (ref 6–23)
CHLORIDE: 97 meq/L (ref 96–112)
CO2: 23 mEq/L (ref 19–32)
CREATININE: 0.6 mg/dL (ref 0.50–1.10)
Calcium: 9.9 mg/dL (ref 8.4–10.5)
GFR calc Af Amer: >90 mL/min (ref >90)
GFR calc non Af Amer: >90 mL/min (ref >90)
Glucose, Bld: 88 mg/dL (ref 70–99)
Potassium: 3.8 mEq/L (ref 3.7–5.3)
SODIUM: 135 meq/L — AB (ref 137–147)
Total Bilirubin: <0.2 mg/dL — ABNORMAL LOW (ref 0.3–1.2)
Total Protein: 6.9 g/dL (ref 6.0–8.3)

## 2014-04-16 LAB — CBC WITH DIFFERENTIAL/PLATELET
BASOS PCT: 0 % (ref 0–1)
Basophils Absolute: 0 10*3/uL (ref 0.0–0.1)
Eosinophils Absolute: 0.1 10*3/uL (ref 0.0–0.7)
Eosinophils Relative: 0 % (ref 0–5)
HCT: 30.9 % — ABNORMAL LOW (ref 36.0–46.0)
Hemoglobin: 10.8 g/dL — ABNORMAL LOW (ref 12.0–15.0)
Lymphocytes Relative: 14 % (ref 12–46)
Lymphs Abs: 2.5 10*3/uL (ref 0.7–4.0)
MCH: 31.2 pg (ref 26.0–34.0)
MCHC: 35 g/dL (ref 30.0–36.0)
MCV: 89.3 fL (ref 78.0–100.0)
MONO ABS: 0.9 10*3/uL (ref 0.1–1.0)
Monocytes Relative: 5 % (ref 3–12)
NEUTROS ABS: 15.1 10*3/uL — AB (ref 1.7–7.7)
Neutrophils Relative %: 81 % — ABNORMAL HIGH (ref 43–77)
Platelets: 325 10*3/uL (ref 150–400)
RBC: 3.46 MIL/uL — ABNORMAL LOW (ref 3.87–5.11)
RDW: 13.7 % (ref 11.5–15.5)
WBC: 18.6 10*3/uL — AB (ref 4.0–10.5)

## 2014-04-16 LAB — URINALYSIS, ROUTINE W REFLEX MICROSCOPIC
BILIRUBIN URINE: NEGATIVE
Glucose, UA: NEGATIVE mg/dL
Ketones, ur: NEGATIVE mg/dL
Leukocytes, UA: NEGATIVE
Nitrite: NEGATIVE
Protein, ur: NEGATIVE mg/dL
SPECIFIC GRAVITY, URINE: 1.01 (ref 1.005–1.030)
Urobilinogen, UA: 0.2 mg/dL (ref 0.0–1.0)
pH: 7 (ref 5.0–8.0)

## 2014-04-16 LAB — URINE MICROSCOPIC-ADD ON

## 2014-04-16 LAB — WET PREP, GENITAL
CLUE CELLS WET PREP: NONE SEEN
TRICH WET PREP: NONE SEEN
Yeast Wet Prep HPF POC: NONE SEEN

## 2014-04-16 LAB — PROTEIN / CREATININE RATIO, URINE
Creatinine, Urine: 45.67 mg/dL
Protein Creatinine Ratio: 0.3 — ABNORMAL HIGH (ref 0.00–0.15)
Total Protein, Urine: 13.7 mg/dL

## 2014-04-16 LAB — URIC ACID: Uric Acid, Serum: 4.7 mg/dL (ref 2.4–7.0)

## 2014-04-16 LAB — LACTATE DEHYDROGENASE: LDH: 147 U/L (ref 94–250)

## 2014-04-16 MED ORDER — CEPHALEXIN 500 MG PO CAPS
500.0000 mg | ORAL_CAPSULE | Freq: Two times a day (BID) | ORAL | Status: DC
  Administered 2014-04-16: 500 mg via ORAL
  Filled 2014-04-16 (×2): qty 1

## 2014-04-16 MED ORDER — CEPHALEXIN 500 MG PO CAPS: 500.0000 mg | ORAL_CAPSULE | Freq: Two times a day (BID) | ORAL | Status: AC

## 2014-04-16 MED ORDER — LACTATED RINGERS IV SOLN
INTRAVENOUS | Status: DC
  Administered 2014-04-16: 19:00:00 via INTRAVENOUS

## 2014-04-16 MED ORDER — LABETALOL HCL 200 MG PO TABS: 200.0000 mg | ORAL_TABLET | Freq: Two times a day (BID) | ORAL | Status: AC

## 2014-04-16 MED ORDER — LACTATED RINGERS IV BOLUS (SEPSIS)
500.0000 mL | Freq: Once | INTRAVENOUS | Status: AC
  Administered 2014-04-16: 1000 mL via INTRAVENOUS

## 2014-04-16 MED ORDER — PROMETHAZINE HCL 25 MG/ML IJ SOLN
12.5000 mg | Freq: Once | INTRAMUSCULAR | Status: AC
  Administered 2014-04-16: 12.5 mg via INTRAVENOUS
  Filled 2014-04-16: qty 1

## 2014-04-16 MED ORDER — LABETALOL HCL 100 MG PO TABS
200.0000 mg | ORAL_TABLET | Freq: Two times a day (BID) | ORAL | Status: DC
  Administered 2014-04-16: 200 mg via ORAL
  Filled 2014-04-16: qty 2

## 2014-04-16 MED ORDER — BUTORPHANOL TARTRATE 1 MG/ML IJ SOLN
1.0000 mg | Freq: Once | INTRAMUSCULAR | Status: AC
  Administered 2014-04-16: 1 mg via INTRAVENOUS
  Filled 2014-04-16: qty 1

## 2014-04-16 NOTE — Discharge Instructions (Signed)

## 2014-04-16 NOTE — MAU Provider Note (Signed)
History   33 yo G2P1001 at 24 3/7 weeks presented unannounced c/o RLQ and right flank/back pain x 2 days, with worsening today.  Also reports urinary pressure, urgency, and frequency.  Hx kidney stones in past.  Reports nausea, but no vomiting.  Reports sporadic Braxton-Hicks, denies leaking, d/c, or bleeding.    Last seen at office on 7/2 for NOB visit--insurance has been an issue.  Needs appt for anatomy US.  Patient Active Problem List   Diagnosis Date Noted  . Vomiting affecting pregnancy 03/10/2014  . H/O mastitis--severe, staph aureus-after last delivery 03/10/2014  . H/O postpartum depression, currently pregnant--severe, required hospitalization 03/10/2014  . History of gestational hypertension 03/10/2014  . History of kidney stones 03/10/2014  . Anxiety and depression--on Sertraline, buspirone and remeron 03/10/2014  . Rubella non-immune status, antepartum 03/10/2014  . Latex allergy 03/10/2014  . Vancomycin adverse reaction 03/10/2014    Chief Complaint  Patient presents with  . Contractions   HPI:  See above  OB History   Grav Para Term Preterm Abortions TAB SAB Ect Mult Living   2 1 1       1     2012--SVB at term, female, gestational hypertension, home on Procardia XL.  Had severe mastitis weeks pp, requiring 10 day hospitalization and I&D of abscess. Hospitalized for severe pp depression approx 1 1/2 months after delivery.  Remains on psychotropic meds.  Past Medical History  Diagnosis Date  . Hypertension   . Anxiety   . Depression     post partum  . Allergy   . Breast abscess   . Mastitis   . Headache(784.0)   . Hyperemesis     Past Surgical History  Procedure Laterality Date  . Incise and drain abcess    . Mouth surgery    . Incision and drainage breast abscess    . Orif ankle fracture  05/10/2012    Procedure: OPEN REDUCTION INTERNAL FIXATION (ORIF) ANKLE FRACTURE;  Surgeon: Vickki HearingStanley E Harrison, MD;  Location: [facility];  Service: Orthopedics;  Laterality:  Right;    Family History  Problem Relation Age of Onset  . Cancer Mother     breast  . Alcohol abuse Sister     History  Substance Use Topics  . Smoking status: Never Smoker   . Smokeless tobacco: Never Used  . Alcohol Use: No    Allergies:  Allergies  Allergen Reactions  . Morphine And Related Itching  . Promethazine Hcl Itching and Other (See Comments)    Pt states that this medication caused "psychological effects".  . Vancomycin Itching and Swelling  . Adhesive [Tape] Rash  . Latex Rash    Prescriptions prior to admission  Medication Sig Dispense Refill  . acetaminophen (TYLENOL) 325 MG tablet Take 2 tablets (650 mg total) by mouth every 6 (six) hours as needed.  60 tablet  2  . busPIRone (BUSPAR) 15 MG tablet Take 15 mg by mouth every 6 (six) hours as needed (for anxiety).       Marland Kitchen. metoCLOPramide (REGLAN) 10 MG tablet Take 10 mg by mouth every 6 (six) hours as needed for nausea or vomiting.      . mirtazapine (REMERON) 15 MG tablet Take 15 mg by mouth at bedtime.      . ondansetron (ZOFRAN ODT) 8 MG disintegrating tablet Take 1 tablet (8 mg total) by mouth every 8 (eight) hours as needed for nausea or vomiting.  30 tablet  2  . pantoprazole (PROTONIX) 40 MG  tablet Take 1 tablet (40 mg total) by mouth daily.  60 tablet  2  . PARoxetine (PAXIL) 30 MG tablet Take 30 mg by mouth at bedtime.      . Prenatal Vit-Fe Fumarate-FA (PRENATAL MULTIVITAMIN) TABS tablet Take 1 tablet by mouth daily.       . sertraline (ZOLOFT) 50 MG tablet Take 50 mg by mouth daily.      Marland Kitchen zolpidem (AMBIEN) 10 MG tablet Take 10 mg by mouth at bedtime.        ROS:  RLQ pain, right flank pain, cramping, dysuria, urgency/frequency Physical Exam   Blood pressure 155/96, pulse 96, temperature 97.7 F (36.5 C), temperature source Oral, resp. rate 16, height 5' 0.5" (1.537 m), weight 134 lb 8 oz (61.009 kg).  Physical Exam In moderate distress with pain Chest clear Heart RRR without mumur Abd  gravid, NT Back +right CVAT Pelvic--small amount white d/c in vault, cervix closed, long, NT Ext WNL  FHR 140-150, no decels No UCs  ED Course  Assessment: IUP at 24 3/7 weeks RLQ/flank pain Urinary sx--hx kidney stones Gestational hypertension vs chronic hypertension Hx severe pp depresssion/chronic depression  Plan: UA, urine to culture IV hydration IV pain med CBC, diff, CMP, LDH, uric acid GC, chlamydia, wet prep Stadol/phenergan Renal US   Nigel Bridgeman CNM, MSN 04/16/2014 6:29 PM  Addendum: Noted sensitivity to Phenergan after ordered and administered--patient tolerating 12.5 mg dose without difficulty.  Results for orders placed during the hospital encounter of 04/16/14 (from the past 24 hour(s))  URINALYSIS, ROUTINE W REFLEX MICROSCOPIC     Status: Abnormal   Collection Time    04/16/14  5:05 PM      Result Value Ref Range   Color, Urine AMBER (*) YELLOW   APPearance HAZY (*) CLEAR   Specific Gravity, Urine 1.010  1.005 - 1.030   pH 7.0  5.0 - 8.0   Glucose, UA NEGATIVE  NEGATIVE mg/dL   Hgb urine dipstick LARGE (*) NEGATIVE   Bilirubin Urine NEGATIVE  NEGATIVE   Ketones, ur NEGATIVE  NEGATIVE mg/dL   Protein, ur NEGATIVE  NEGATIVE mg/dL   Urobilinogen, UA 0.2  0.0 - 1.0 mg/dL   Nitrite NEGATIVE  NEGATIVE   Leukocytes, UA NEGATIVE  NEGATIVE  URINE MICROSCOPIC-ADD ON     Status: None   Collection Time    04/16/14  5:05 PM      Result Value Ref Range   Squamous Epithelial / LPF RARE  RARE   WBC, UA 0-2  <3 WBC/hpf   RBC / HPF TOO NUMEROUS TO COUNT  <3 RBC/hpf   Bacteria, UA RARE  RARE  CBC WITH DIFFERENTIAL     Status: Abnormal   Collection Time    04/16/14  6:00 PM      Result Value Ref Range   WBC 18.6 (*) 4.0 - 10.5 K/uL   RBC 3.46 (*) 3.87 - 5.11 MIL/uL   Hemoglobin 10.8 (*) 12.0 - 15.0 g/dL   HCT 40.9 (*) 81.1 - 91.4 %   MCV 89.3  78.0 - 100.0 fL   MCH 31.2  26.0 - 34.0 pg   MCHC 35.0  30.0 - 36.0 g/dL   RDW 78.2  95.6 - 21.3 %   Platelets  325  150 - 400 K/uL   Neutrophils Relative % 81 (*) 43 - 77 %   Neutro Abs 15.1 (*) 1.7 - 7.7 K/uL   Lymphocytes Relative 14  12 - 46 %  Lymphs Abs 2.5  0.7 - 4.0 K/uL   Monocytes Relative 5  3 - 12 %   Monocytes Absolute 0.9  0.1 - 1.0 K/uL   Eosinophils Relative 0  0 - 5 %   Eosinophils Absolute 0.1  0.0 - 0.7 K/uL   Basophils Relative 0  0 - 1 %   Basophils Absolute 0.0  0.0 - 0.1 K/uL  COMPREHENSIVE METABOLIC PANEL     Status: Abnormal   Collection Time    04/16/14  6:00 PM      Result Value Ref Range   Sodium 135 (*) 137 - 147 mEq/L   Potassium 3.8  3.7 - 5.3 mEq/L   Chloride 97  96 - 112 mEq/L   CO2 23  19 - 32 mEq/L   Glucose, Bld 88  70 - 99 mg/dL   BUN 12  6 - 23 mg/dL   Creatinine, Ser 1.61  0.50 - 1.10 mg/dL   Calcium 9.9  8.4 - 09.6 mg/dL   Total Protein 6.9  6.0 - 8.3 g/dL   Albumin 3.2 (*) 3.5 - 5.2 g/dL   AST 13  0 - 37 U/L   ALT 11  0 - 35 U/L   Alkaline Phosphatase 67  39 - 117 U/L   Total Bilirubin <0.2 (*) 0.3 - 1.2 mg/dL   GFR calc non Af Amer >90  >90 mL/min   GFR calc Af Amer >90  >90 mL/min   Anion gap 15  5 - 15  LACTATE DEHYDROGENASE     Status: None   Collection Time    04/16/14  6:00 PM      Result Value Ref Range   LDH 147  94 - 250 U/L  URIC ACID     Status: None   Collection Time    04/16/14  6:00 PM      Result Value Ref Range   Uric Acid, Serum 4.7  2.4 - 7.0 mg/dL   Impression: IUP at 24 4/7 weeks ? Kidney stone Likely chronic hypertension, exacerbated by pregnancy/pain Hx severe pp depression/chronic depression  Plan: Consulted with Dr. Su Hilt. Keflex 500 mg po now, and BID x 7 days--will need Rx Labetalol 200 mg now, and BID--will need Rx PCR added to labs Urine culture ordered and pending. Await renal US. Anticipate d/c home with strainer, plan review of precautions for pyelonephritis or worsening sx. Will likely need pain med Rx for po use--will need to ask patient what pain meds she has taken in the past without  difficulty. Will need appt this week for f/u--needs anatomy US. Venus Standard, CNM, will f/u on patient status.  Nigel Bridgeman, CNM 04/16/14 6:50p

## 2014-04-16 NOTE — MAU Note (Signed)
Patient presents with complaint of Braxton Hicks contractions today.

## 2014-04-16 NOTE — MAU Provider Note (Signed)
Equilla Duffy RhodyStanley is a 33 y.o. G2 P12 at 24.3 weeks.  Reports she feel better.   History     Patient Active Problem List   Diagnosis Date Noted  . Vomiting affecting pregnancy 03/10/2014  . H/O mastitis--severe, staph aureus-after last delivery 03/10/2014  . H/O postpartum depression, currently pregnant--severe, required hospitalization 03/10/2014  . History of gestational hypertension 03/10/2014  . History of kidney stones 03/10/2014  . Anxiety and depression--on Sertraline, buspirone and remeron 03/10/2014  . Rubella non-immune status, antepartum 03/10/2014  . Latex allergy 03/10/2014  . Vancomycin adverse reaction 03/10/2014    Chief Complaint  Patient presents with  . Contractions   HPI  OB History   Grav Para Term Preterm Abortions TAB SAB Ect Mult Living   2 1 1       1       Past Medical History  Diagnosis Date  . Hypertension   . Anxiety   . Depression     post partum  . Allergy   . Breast abscess   . Mastitis   . Headache(784.0)   . Hyperemesis     Past Surgical History  Procedure Laterality Date  . Incise and drain abcess    . Mouth surgery    . Incision and drainage breast abscess    . Orif ankle fracture  05/10/2012    Procedure: OPEN REDUCTION INTERNAL FIXATION (ORIF) ANKLE FRACTURE;  Surgeon: Vickki HearingStanley E Harrison, MD;  Location: [facility];  Service: Orthopedics;  Laterality: Right;    Family History  Problem Relation Age of Onset  . Cancer Mother     breast  . Alcohol abuse Sister     History  Substance Use Topics  . Smoking status: Never Smoker   . Smokeless tobacco: Never Used  . Alcohol Use: No    Allergies:  Allergies  Allergen Reactions  . Morphine And Related Itching  . Promethazine Hcl Itching and Other (See Comments)    Pt states that this medication caused "psychological effects" at one point, but has taken at other times without difficulty.  . Vancomycin Itching and Swelling  . Adhesive [Tape] Rash  . Latex Rash    No  prescriptions prior to admission    ROS See HPI above, all other systems are negative  Physical Exam   Blood pressure 138/81, pulse 88, temperature 97.7 F (36.5 C), temperature source Oral, resp. rate 16, height 5' 0.5" (1.537 m), weight 134 lb 8 oz (61.009 kg). Filed Vitals:   04/16/14 1730 04/16/14 1745 04/16/14 1800 04/16/14 2002  BP: 155/96 164/95 161/96 138/81  Pulse: 96 92 95 88  Temp:      TempSrc:      Resp:    16  Height:      Weight:        Physical Exam  WNL   ED Course  Assessment: IUP at  24.3 weeks Membranes: 1 FHR: reactive CTX:  N/a Renal US: Right Kidney moderate hydronephrosis, left kidnes nild to moderate hydronephrosis.  Possible physiologic or related to pregnancy.  Bladder WNL   Plan: DC to home with pyelonephritis and kidney stone precautions  24 urine collection with instruction to be collected at home and returned tomorrow Pt to return tomorrow for a BP check and FU and be seen in the office on Monday Give pt a urine strainer with instruction on how to use Keflex 500mg  bid x 7 days Labetalol 200mg  bid Pt to make appointment for anatomy US  Consult with Dr.  roberts   Geisha Abernathy, CNM, MSN 04/16/2014. 9:38 PM

## 2014-04-18 LAB — URINE CULTURE

## 2014-04-18 LAB — GC/CHLAMYDIA PROBE AMP
CT Probe RNA: NEGATIVE
GC Probe RNA: NEGATIVE

## 2014-06-18 IMAGING — CT CT ABD-PELV W/O CM
2 of 3 series · 17 of 46 positions shown, 19 images · non-contrast
Comparison: 01/20/2010.

CLINICAL DATA: Left lower quadrant and left flank pain.  History of
kidney stones.

CT ABDOMEN AND PELVIS WITHOUT CONTRAST
TECHNIQUE: Multidetector CT imaging of the abdomen and pelvis was
performed following the standard protocol without intravenous
contrast.

[Series 2: renal stone < 200 lbs 5.0 b31f · axial · 0.66mm/px · z∈[-378,-28]mm · 14 of 82 slices shown, 16 images]
[im 6/82  soft-tissue]
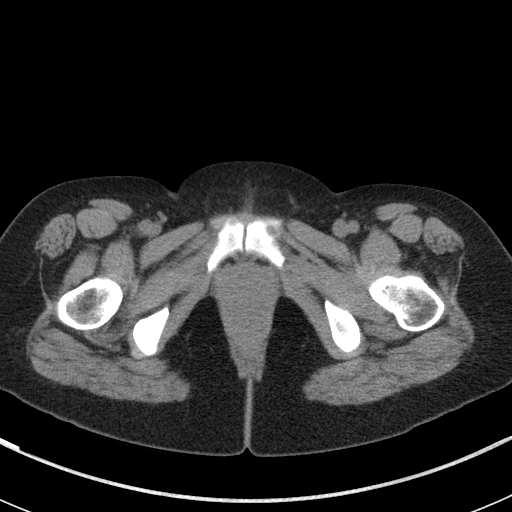
[im 6/82  bone]
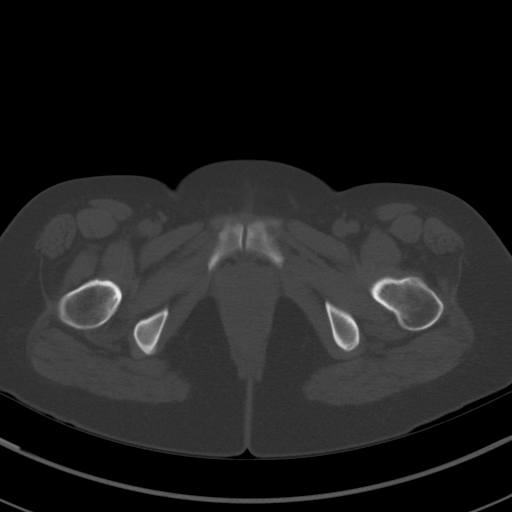
[im 11/82  soft-tissue]
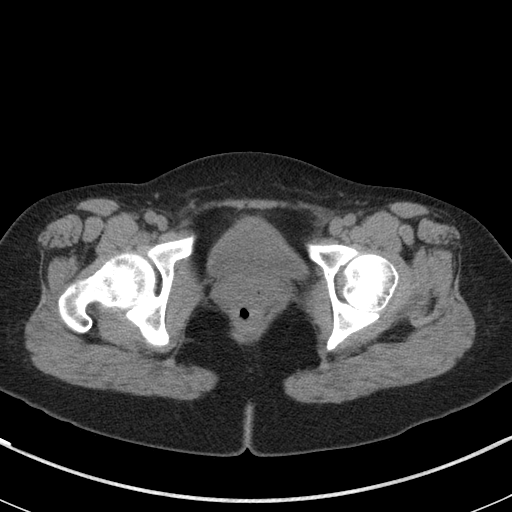
[im 16/82  soft-tissue]
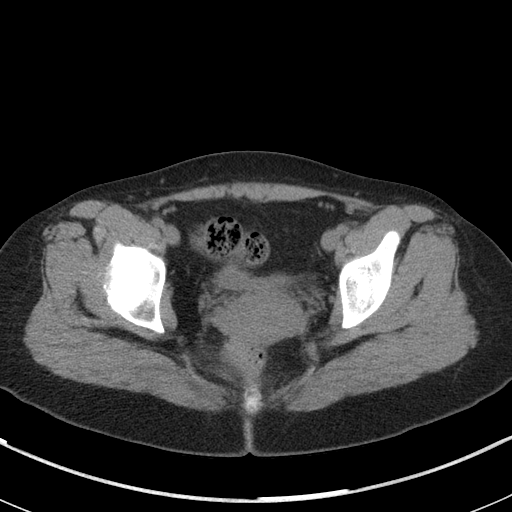
[im 21/82  soft-tissue]
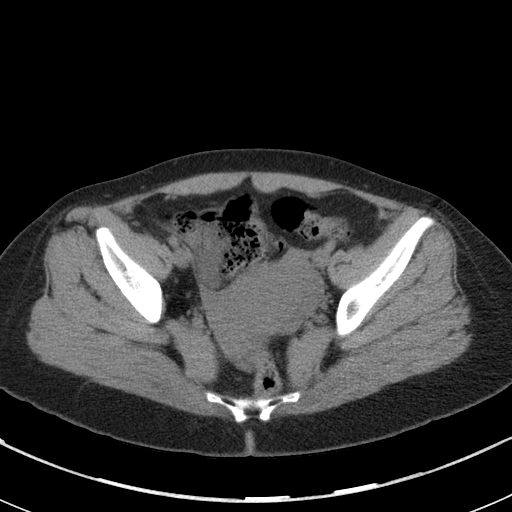
[im 27/82  soft-tissue]
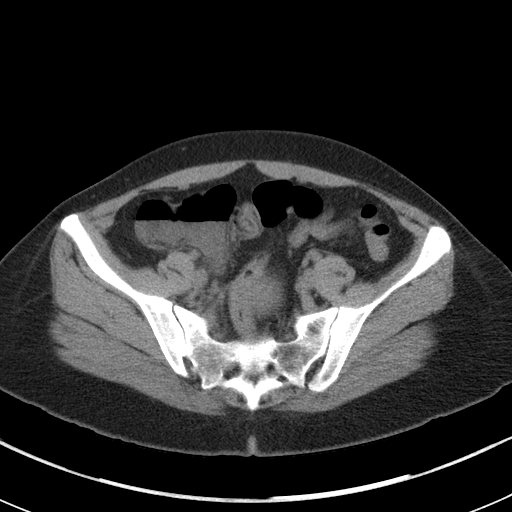
[im 32/82  soft-tissue]
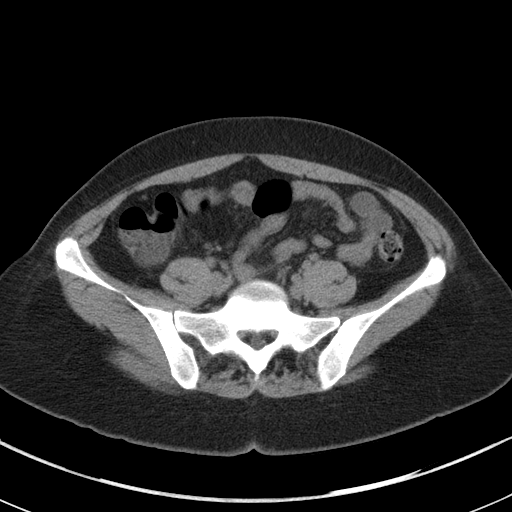
[im 37/82  soft-tissue]
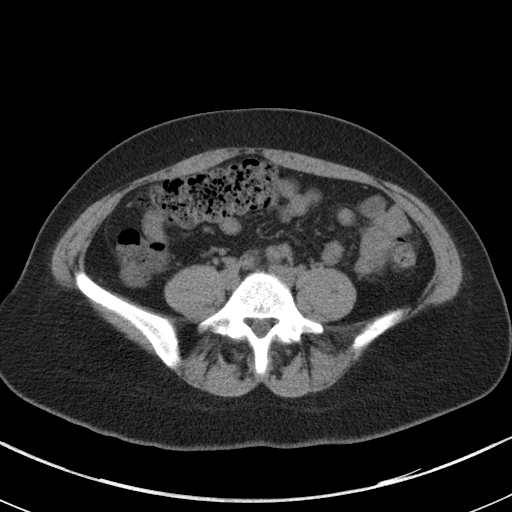
[im 45/82  soft-tissue]
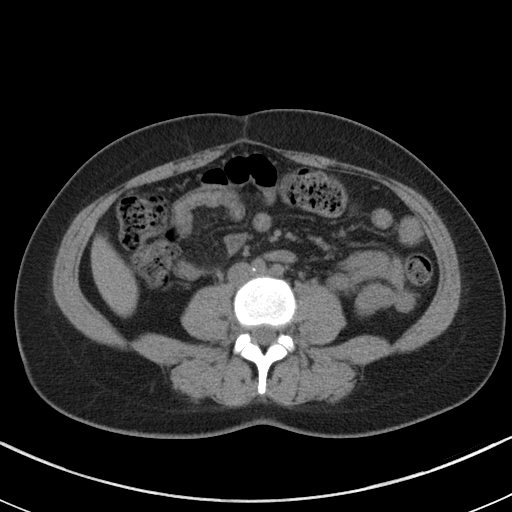
[im 50/82  soft-tissue]
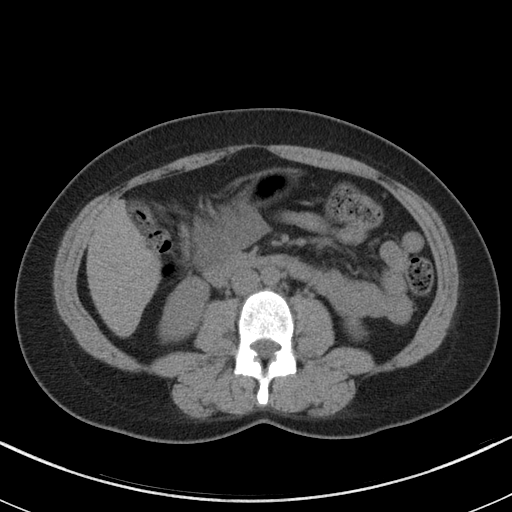
[im 50/82  bone]
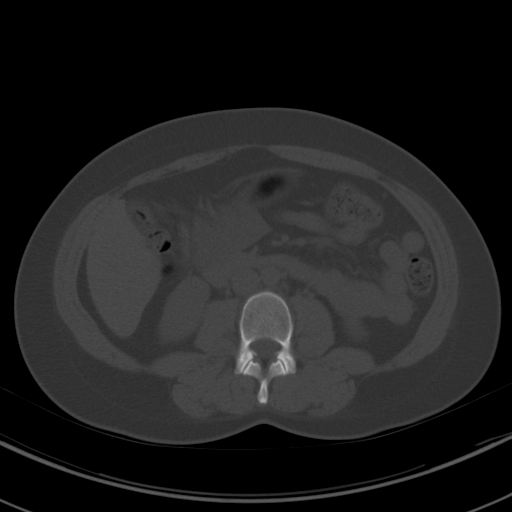
[im 55/82  soft-tissue]
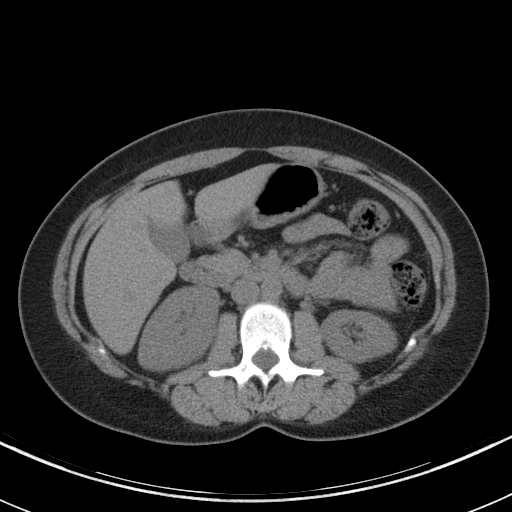
[im 61/82  soft-tissue]
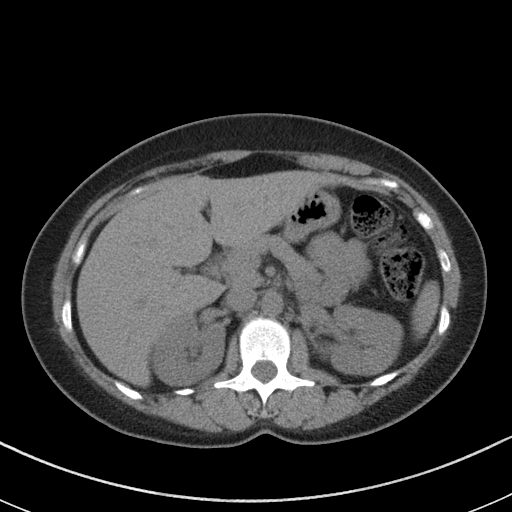
[im 66/82  soft-tissue]
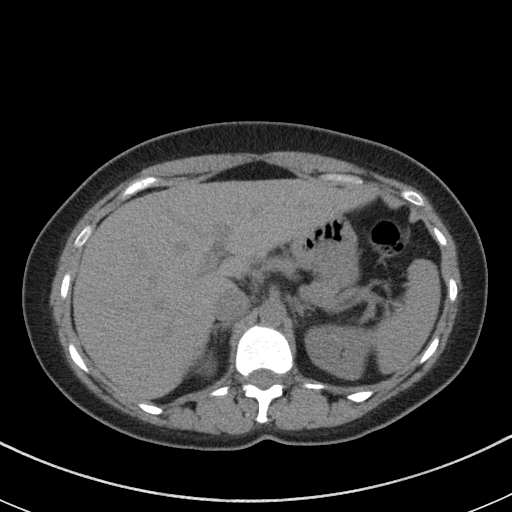
[im 71/82  soft-tissue]
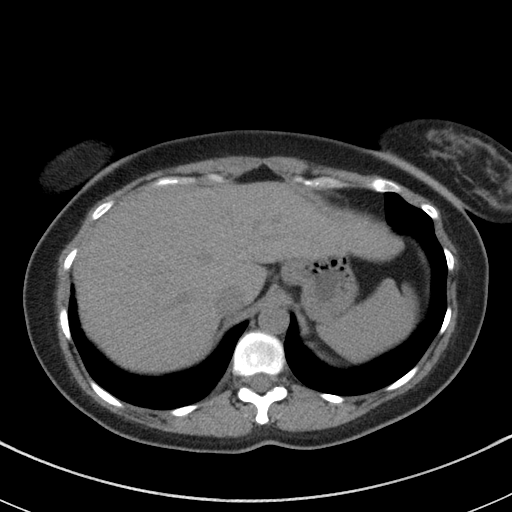
[im 76/82  soft-tissue]
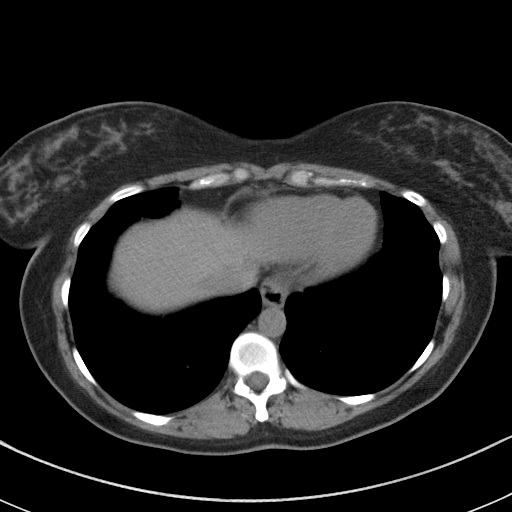

[Series 5: renal stone 3.0 coronal · coronal · 0.63mm/px · 3 of 76 slices shown]
[im 26/76  soft-tissue]
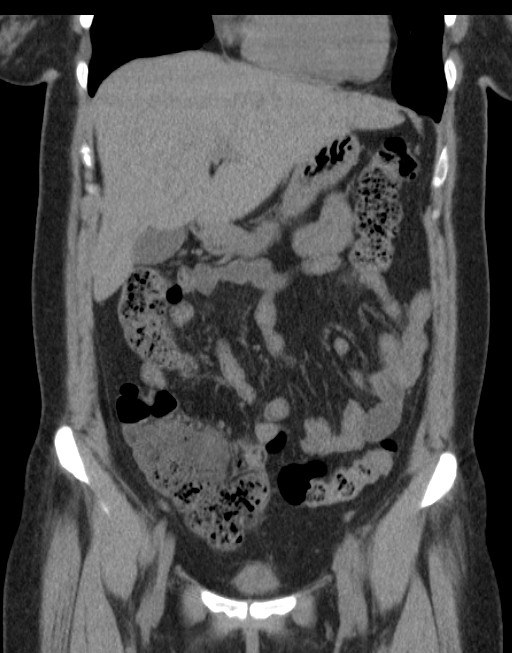
[im 34/76  soft-tissue]
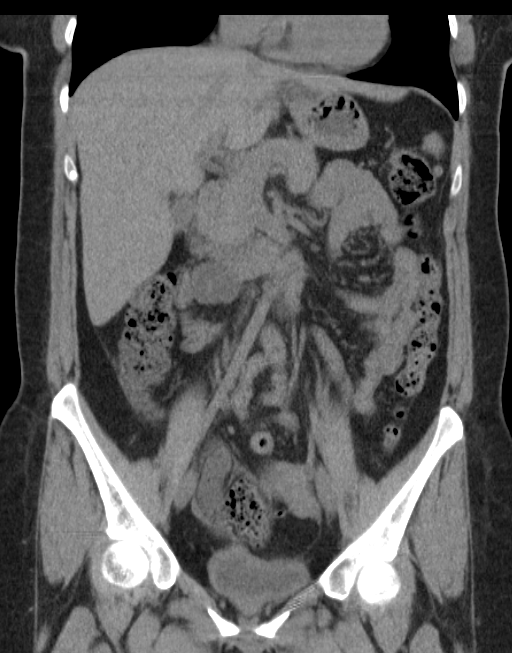
[im 42/76  soft-tissue]
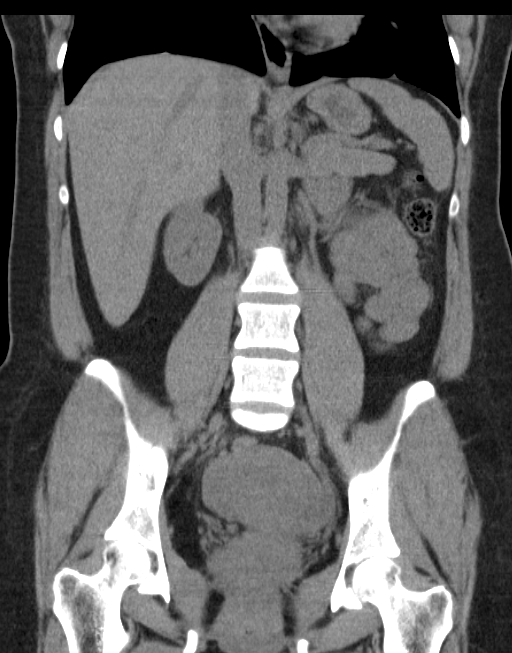

[17 of 46 positions shown; findings below may reference images not displayed]

FINDINGS: Lung bases show no acute findings.  Heart size normal.
No pericardial or pleural effusion.

Liver, gallbladder and adrenal glands are unremarkable.  A 5 mm
stone is seen in the lower pole right kidney.  Punctate stone in
the lower pole left kidney.  No hydronephrosis.  Spleen, pancreas,
stomach, small bowel, appendix and proximal colon are unremarkable.
A fair amount of stool is seen within the colon.

Uterus is visualized.  Ovaries are not well seen separate from the
uterus.  No pathologically enlarged lymph nodes.  There may be
trace pelvic free fluid.  No worrisome lytic or sclerotic lesions.
IMPRESSION: 1.  No acute findings to explain the patient's given symptoms.
2.  Bilateral renal stones.
3.  Question constipation.

## 2014-07-18 ENCOUNTER — Encounter (HOSPITAL_COMMUNITY): Payer: Self-pay | Admitting: *Deleted

## 2014-07-20 LAB — OB RESULTS CONSOLE GBS: GBS: NEGATIVE

## 2014-08-09 ENCOUNTER — Encounter (HOSPITAL_COMMUNITY): Payer: Self-pay | Admitting: *Deleted

## 2014-08-09 ENCOUNTER — Telehealth (HOSPITAL_COMMUNITY): Payer: Self-pay | Admitting: *Deleted

## 2014-08-09 NOTE — Telephone Encounter (Signed)
Preadmission screen  

## 2014-08-10 ENCOUNTER — Encounter (HOSPITAL_COMMUNITY): Payer: Self-pay

## 2014-08-10 ENCOUNTER — Inpatient Hospital Stay (HOSPITAL_COMMUNITY)
Admission: RE | Admit: 2014-08-10 | Discharge: 2014-08-13 | DRG: 774 | Disposition: A | Discharge status: Home / Self Care | Payer: Medicaid Other | Source: Ambulatory Visit | Attending: Obstetrics and Gynecology | Admitting: Obstetrics and Gynecology

## 2014-08-10 DIAGNOSIS — F419 Anxiety disorder, unspecified: Secondary | ICD-10-CM | POA: Diagnosis present

## 2014-08-10 DIAGNOSIS — O1092 Unspecified pre-existing hypertension complicating childbirth: Secondary | ICD-10-CM | POA: Diagnosis present

## 2014-08-10 DIAGNOSIS — O99214 Obesity complicating childbirth: Secondary | ICD-10-CM | POA: Diagnosis present

## 2014-08-10 DIAGNOSIS — O99344 Other mental disorders complicating childbirth: Secondary | ICD-10-CM | POA: Diagnosis present

## 2014-08-10 DIAGNOSIS — Z2839 Other underimmunization status: Secondary | ICD-10-CM

## 2014-08-10 DIAGNOSIS — Z8659 Personal history of other mental and behavioral disorders: Secondary | ICD-10-CM

## 2014-08-10 DIAGNOSIS — Z283 Underimmunization status: Secondary | ICD-10-CM

## 2014-08-10 DIAGNOSIS — Z3483 Encounter for supervision of other normal pregnancy, third trimester: Secondary | ICD-10-CM | POA: Diagnosis present

## 2014-08-10 DIAGNOSIS — Z3A39 39 weeks gestation of pregnancy: Secondary | ICD-10-CM | POA: Diagnosis present

## 2014-08-10 DIAGNOSIS — T368X5A Adverse effect of other systemic antibiotics, initial encounter: Secondary | ICD-10-CM | POA: Diagnosis present

## 2014-08-10 DIAGNOSIS — O10919 Unspecified pre-existing hypertension complicating pregnancy, unspecified trimester: Secondary | ICD-10-CM | POA: Diagnosis present

## 2014-08-10 DIAGNOSIS — O9902 Anemia complicating childbirth: Secondary | ICD-10-CM | POA: Diagnosis present

## 2014-08-10 DIAGNOSIS — O9989 Other specified diseases and conditions complicating pregnancy, childbirth and the puerperium: Secondary | ICD-10-CM

## 2014-08-10 DIAGNOSIS — Z6831 Body mass index (BMI) 31.0-31.9, adult: Secondary | ICD-10-CM

## 2014-08-10 DIAGNOSIS — F32A Depression, unspecified: Secondary | ICD-10-CM | POA: Diagnosis present

## 2014-08-10 DIAGNOSIS — F329 Major depressive disorder, single episode, unspecified: Secondary | ICD-10-CM | POA: Diagnosis present

## 2014-08-10 DIAGNOSIS — Z91040 Latex allergy status: Secondary | ICD-10-CM | POA: Diagnosis present

## 2014-08-10 DIAGNOSIS — D649 Anemia, unspecified: Secondary | ICD-10-CM | POA: Diagnosis present

## 2014-08-10 LAB — COMPREHENSIVE METABOLIC PANEL
ALT: 11 U/L (ref 0–35)
AST: 16 U/L (ref 0–37)
Albumin: 2.7 g/dL — ABNORMAL LOW (ref 3.5–5.2)
Alkaline Phosphatase: 144 U/L — ABNORMAL HIGH (ref 39–117)
Anion gap: 17 — ABNORMAL HIGH (ref 5–15)
BUN: 8 mg/dL (ref 6–23)
CALCIUM: 9.2 mg/dL (ref 8.4–10.5)
CHLORIDE: 102 meq/L (ref 96–112)
CO2: 17 mEq/L — ABNORMAL LOW (ref 19–32)
Creatinine, Ser: 0.66 mg/dL (ref 0.50–1.10)
GFR calc Af Amer: >90 mL/min (ref >90)
Glucose, Bld: 112 mg/dL — ABNORMAL HIGH (ref 70–99)
Potassium: 3.5 mEq/L — ABNORMAL LOW (ref 3.7–5.3)
SODIUM: 136 meq/L — AB (ref 137–147)
Total Bilirubin: <0.2 mg/dL — ABNORMAL LOW (ref 0.3–1.2)
Total Protein: 6.3 g/dL (ref 6.0–8.3)

## 2014-08-10 LAB — CBC
HEMATOCRIT: 27.4 % — AB (ref 36.0–46.0)
Hemoglobin: 9.4 g/dL — ABNORMAL LOW (ref 12.0–15.0)
MCH: 30.2 pg (ref 26.0–34.0)
MCHC: 34.3 g/dL (ref 30.0–36.0)
MCV: 88.1 fL (ref 78.0–100.0)
Platelets: 277 10*3/uL (ref 150–400)
RBC: 3.11 MIL/uL — AB (ref 3.87–5.11)
RDW: 14.2 % (ref 11.5–15.5)
WBC: 13.2 10*3/uL — AB (ref 4.0–10.5)

## 2014-08-10 LAB — PROTEIN / CREATININE RATIO, URINE
CREATININE, URINE: 97.42 mg/dL
PROTEIN CREATININE RATIO: 0.17 — AB (ref 0.00–0.15)
Total Protein, Urine: 16.6 mg/dL

## 2014-08-10 LAB — TYPE AND SCREEN
ABO/RH(D): O POS
Antibody Screen: NEGATIVE

## 2014-08-10 LAB — LACTATE DEHYDROGENASE: LDH: 187 U/L (ref 94–250)

## 2014-08-10 LAB — URIC ACID: URIC ACID, SERUM: 6.8 mg/dL (ref 2.4–7.0)

## 2014-08-10 MED ORDER — NALBUPHINE HCL 10 MG/ML IJ SOLN
10.0000 mg | INTRAMUSCULAR | Status: DC | PRN
  Administered 2014-08-10 – 2014-08-11 (×3): 10 mg via INTRAVENOUS
  Filled 2014-08-10 (×3): qty 1

## 2014-08-10 MED ORDER — ZOLPIDEM TARTRATE 5 MG PO TABS
5.0000 mg | ORAL_TABLET | Freq: Every day | ORAL | Status: DC
  Administered 2014-08-10 – 2014-08-12 (×3): 5 mg via ORAL
  Filled 2014-08-10 (×3): qty 1

## 2014-08-10 MED ORDER — OXYTOCIN 40 UNITS IN LACTATED RINGERS INFUSION - SIMPLE MED
1.0000 m[IU]/min | INTRAVENOUS | Status: DC
  Administered 2014-08-11: 1 m[IU]/min via INTRAVENOUS

## 2014-08-10 MED ORDER — BUSPIRONE HCL 15 MG PO TABS
15.0000 mg | ORAL_TABLET | Freq: Four times a day (QID) | ORAL | Status: DC
  Administered 2014-08-10 – 2014-08-13 (×7): 15 mg via ORAL
  Filled 2014-08-10 (×11): qty 1

## 2014-08-10 MED ORDER — MIRTAZAPINE 15 MG PO TABS
15.0000 mg | ORAL_TABLET | Freq: Every day | ORAL | Status: DC
  Administered 2014-08-10 – 2014-08-12 (×3): 15 mg via ORAL
  Filled 2014-08-10: qty 1
  Filled 2014-08-10: qty 0.5
  Filled 2014-08-10: qty 1

## 2014-08-10 MED ORDER — TERBUTALINE SULFATE 1 MG/ML IJ SOLN: 0.2500 mg | Freq: Once | INTRAMUSCULAR | Status: AC | PRN

## 2014-08-10 MED ORDER — OXYCODONE-ACETAMINOPHEN 5-325 MG PO TABS: 2.0000 | ORAL_TABLET | ORAL | Status: DC | PRN

## 2014-08-10 MED ORDER — LACTATED RINGERS IV SOLN
500.0000 mL | INTRAVENOUS | Status: DC | PRN
  Administered 2014-08-11: 500 mL via INTRAVENOUS
  Administered 2014-08-11: 1000 mL via INTRAVENOUS

## 2014-08-10 MED ORDER — OXYCODONE-ACETAMINOPHEN 5-325 MG PO TABS
1.0000 | ORAL_TABLET | ORAL | Status: DC | PRN
  Administered 2014-08-11: 1 via ORAL
  Filled 2014-08-10: qty 1

## 2014-08-10 MED ORDER — LIDOCAINE HCL (PF) 1 % IJ SOLN
30.0000 mL | INTRAMUSCULAR | Status: DC | PRN
Start: 2014-08-10 — End: 2014-08-11
  Filled 2014-08-10: qty 30

## 2014-08-10 MED ORDER — SERTRALINE HCL 50 MG PO TABS
75.0000 mg | ORAL_TABLET | Freq: Every day | ORAL | Status: DC
  Administered 2014-08-10 – 2014-08-12 (×3): 75 mg via ORAL
  Filled 2014-08-10 (×3): qty 1

## 2014-08-10 MED ORDER — OXYTOCIN 40 UNITS IN LACTATED RINGERS INFUSION - SIMPLE MED
62.5000 mL/h | INTRAVENOUS | Status: DC
  Filled 2014-08-10: qty 1000

## 2014-08-10 MED ORDER — LACTATED RINGERS IV SOLN
INTRAVENOUS | Status: DC
  Administered 2014-08-10 – 2014-08-11 (×2): via INTRAVENOUS

## 2014-08-10 MED ORDER — CITRIC ACID-SODIUM CITRATE 334-500 MG/5ML PO SOLN: 30.0000 mL | ORAL | Status: DC | PRN

## 2014-08-10 MED ORDER — OXYTOCIN BOLUS FROM INFUSION
500.0000 mL | INTRAVENOUS | Status: DC
  Administered 2014-08-11: 500 mL via INTRAVENOUS

## 2014-08-10 MED ORDER — ONDANSETRON HCL 4 MG/2ML IJ SOLN
4.0000 mg | Freq: Four times a day (QID) | INTRAMUSCULAR | Status: DC | PRN
  Administered 2014-08-11 (×2): 4 mg via INTRAVENOUS
  Filled 2014-08-10 (×2): qty 2

## 2014-08-10 MED ORDER — LABETALOL HCL 200 MG PO TABS
200.0000 mg | ORAL_TABLET | Freq: Two times a day (BID) | ORAL | Status: DC
  Administered 2014-08-10 – 2014-08-13 (×6): 200 mg via ORAL
  Filled 2014-08-10 (×9): qty 1

## 2014-08-10 MED ORDER — ACETAMINOPHEN 325 MG PO TABS: 650.0000 mg | ORAL_TABLET | ORAL | Status: DC | PRN

## 2014-08-10 NOTE — Plan of Care (Signed)
Problem: Phase I Progression Outcomes Goal: Pain controlled with appropriate interventions Patient planning on using epidural for pain control

## 2014-08-10 NOTE — Plan of Care (Signed)
Problem: Phase I Progression Outcomes Goal: Medications/IV Fluids N/A Outcome: Completed/Met Date Met:  08/10/14 Reviewed pts home medication list. And blood pressure meds.

## 2014-08-10 NOTE — Progress Notes (Signed)
Brittany Beltran, [id number]   Subjective -Patient for IOL.  Reports multiple bedtime medications.  States has been experiencing blurred vision, but denies HA, other visual disturbances, numbness/tingling, and epigastric pain.  Husband at bedside.   Objective Filed Vitals:   08/10/14 1936  BP: 139/92  Pulse: 100  Resp: 18        Physical Exam  Constitutional: She is oriented to person, place, and time and well-developed, well-nourished, and in no distress.  Cardiovascular: Normal rate, regular rhythm and normal heart sounds.   Pulmonary/Chest: Effort normal and breath sounds normal.  Abdominal: Soft. Bowel sounds are normal.  Appears gravid--fundal height AGA, Soft, NT   Genitourinary: Cervix exhibits no motion tenderness.  Musculoskeletal: Normal range of motion.  Non pitting edema  Neurological: She is alert and oriented to person, place, and time.  Reflex Scores:      Patellar reflexes are 2+ on the right side and 2+ on the left side. Skin: Skin is warm and dry.    HYQ:[id number]:Dilation: 1 Presentation: Vertex Exam by:: Gerrit HeckJessica Simonne Boulos CNM  Pelvis: -Proven: to 6lbs 7oz -Adequate: Yes Leopolds: -Position: Vertex -EFW:~61/2 to 7 1/2lbs  FHR: 125 bpm, Mod Var, -Decels, +Accels UC:  Q2-603min, palpates mild  Induction/Augmentation Agent: Pitocin: None Cytotec: N/A Dose  Membranes: Intact Foley Bulb: Placed at 2115  Assessment IUP at 39wks Cat I FT Bishop Score: 5 Cervical Ripening  Plan -Discussed r/b of induction including fetal distress, serial induction, pain, and increased risk of c/s delivery -Discussed induction methods including cervical ripening agents, foley bulbs, and pitocin -Questions and concerns addressed -Foley bulb placed without difficulty, patient tolerated well -Monitor throughout the night x6 hours. If not spontaneously expelled start pitocin at 0315 -Will order all medications -Continue other mgmt as ordered -Dr. AVS updated on patient status and  agrees with plan  Lovenia Debruler LYNN, CNM 08/10/2014, 8:59 PM

## 2014-08-10 NOTE — H&P (Signed)
Brittany Beltran is a 33 y.o. female, G2P1001 at 8339 weeks, presenting for IOL for Chronic HTN.  Patient is GBS negative, rubella non-immune, and has history of depression, anxiety, and PP psychosis.  Patient currently stable on medications.    Patient Active Problem List   Diagnosis Date Noted  . HTN in pregnancy, chronic 08/10/2014  . Vomiting affecting pregnancy 03/10/2014  . H/O mastitis--severe, staph aureus-after last delivery 03/10/2014  . H/O postpartum depression, currently pregnant--severe, required hospitalization 03/10/2014  . History of gestational hypertension 03/10/2014  . History of kidney stones 03/10/2014  . Anxiety and depression--on Sertraline, buspirone and remeron 03/10/2014  . Rubella non-immune status, antepartum 03/10/2014  . Latex allergy 03/10/2014  . Vancomycin adverse reaction 03/10/2014    History of present pregnancy: Patient entered care at 18.1 weeks.   EDC of 08/17/2014 was established by LMP of 11/10/2013.   Anatomy scan:  22.5 weeks, with normal findings and an posterior placenta.   Additional US evaluations:   -Anatomy US:  EDC c/w dating, posterior placenta, cervix closed, normal anatomy, female, normal fluid.  -37.1wks :  AFI=20.2 BPP 8/8 -37.6wks: NORMAL BPP, CEPHALIC, AFI 17.3cm Significant prenatal events:  [facility]  Dx with proteinuria, elevated BP, bilateral hydronephrosis, ? benign/related to pregnancy. PCR 0.30, normal PIH labs. Cervix closed. Urine culture negative. c/o occasional ligament pain and occasional incontinence. 3rd Trimester: GETS EXHAUSTED, DIZZY SPELLS OCCASIONALLY, c/o  increased lower abdominal pressure. Antidepressant increased by 1/2 tablet. C/O discharge and UC.  Last evaluation:  08/02/2014 by Dr. Carmela HurtE. Kulwa, FHR 135, +FM,  B/P 130/76, wt 164lb  OB History    Gravida Para Term Preterm AB TAB SAB Ectopic Multiple Living   2 1 1       1     -02/2011 at 40.2wks 6lb 7oz Female via NSVD without Epidural.   Past  Medical History  Diagnosis Date  . Hypertension   . Anxiety   . Depression     post partum  . Allergy   . Breast abscess   . Mastitis   . Headache(784.0)   . Hyperemesis   . Bilateral hydronephrosis   . Vaginal Pap smear, abnormal    Past Surgical History  Procedure Laterality Date  . Incise and drain abcess    . Mouth surgery    . Incision and drainage breast abscess    . Orif ankle fracture  05/10/2012    Procedure: OPEN REDUCTION INTERNAL FIXATION (ORIF) ANKLE FRACTURE;  Surgeon: Vickki HearingStanley E Harrison, MD;  Location: [facility];  Service: Orthopedics;  Laterality: Right;   Family History: family history includes Alcohol abuse in her sister; Cancer in her mother, paternal aunt, and sister; Diabetes in her paternal grandfather; Heart failure in her paternal grandmother; Hypertension in her mother; Stroke in her paternal grandfather. Social History:  reports that she has never smoked. She has never used smokeless tobacco. She reports that she does not drink alcohol or use illicit drugs.   Prenatal Transfer Tool  Maternal Diabetes: No Genetic Screening: Declined Maternal Ultrasounds/Referrals: Normal Fetal Ultrasounds or other Referrals:  None Maternal Substance Abuse:  No Significant Maternal Medications:  Meds include: Other: Buspirone, PNV, Labetalol, Metoclopramide, Mirtazapine, Protonix, Zoloft, Ambien Significant Maternal Lab Results: Lab values include: Group B Strep negative, Other: Rubella Non-Immune    ROS:  No complaints at last PNV  Allergies  Allergen Reactions  . Morphine And Related Itching  . Promethazine Hcl Itching and Other (See Comments)    Pt states that  this medication caused "psychological effects" at one point, but has taken at other times without difficulty.  . Vancomycin Itching and Swelling  . Adhesive [Tape] Rash  . Latex Rash       Blood pressure 139/92, pulse 100, resp. rate 18, height 5\' 1"  (1.549 m), weight 168 lb (76.204 kg).  FHR: 130 bpm,  Mod Var, -Decels, +Accels UCs:  Q2-943min  Prenatal labs: ABO, Rh: O/Positive/-- (06/19 0000) Antibody: Negative (06/19 0000) Rubella:   Non Immune RPR: Nonreactive (06/19 0000)  HBsAg: Negative (06/19 0000)  HIV: Non-reactive (06/19 0000)  GBS: Negative (11/04 0000) Sickle cell/Hgb electrophoresis:  N/A Pap:  Abnormal GC:  Negative Chlamydia:  Negative Genetic screenings:  Declined Glucola:  Normal Other:  24 Hr Urine 04/18/14-164, 06/13/14-196   Assessment IUP at 39wks Cat I FT Chronic HTN Depression Anxiety GBS Negative  [facility] with Dr. Doy HutchingAVS Routine Labor and Delivery Orders per [facility] Protocol Augmentation/Induction Orders per [facility] Protocol In room to assess and discuss medication with patient  Phillips ClimesMLY, Irvan Tiedt LYNNCNM, MSN 08/10/2014, 8:16 PM

## 2014-08-10 NOTE — Plan of Care (Signed)
Problem: Consults Goal: NICU Tour Outcome: Not Applicable Date Met:  71/90/70 Goal: Diabetes Guidelines per MD order/protocol Outcome: Not Applicable Date Met:  72/17/11 Goal: Prenatal labs/testing reviewed upon admission Outcome: Completed/Met Date Met:  08/10/14 Goal: Orientation to unit: Plan of Care Outcome: Completed/Met Date Met:  08/10/14 Goal: Orientation to unit: Room Outcome: Completed/Met Date Met:  08/10/14 Goal: Orientation to unit: [place] (smoking, visitation, chaplain services, helpline)  Outcome: Completed/Met Date Met:  08/10/14 Goal: Orientation to unit: Other (Specify with a note) Outcome: Not Applicable Date Met:  65/46/12

## 2014-08-11 ENCOUNTER — Encounter (HOSPITAL_COMMUNITY): Payer: Self-pay

## 2014-08-11 ENCOUNTER — Inpatient Hospital Stay (HOSPITAL_COMMUNITY): Payer: Medicaid Other | Admitting: Anesthesiology

## 2014-08-11 LAB — CBC
HCT: 27.4 % — ABNORMAL LOW (ref 36.0–46.0)
HCT: 25.4 % — ABNORMAL LOW (ref 36.0–46.0)
Hemoglobin: 9.1 g/dL — ABNORMAL LOW (ref 12.0–15.0)
Hemoglobin: 8.4 g/dL — ABNORMAL LOW (ref 12.0–15.0)
MCH: 29.5 pg (ref 26.0–34.0)
MCH: 29.3 pg (ref 26.0–34.0)
MCHC: 33.2 g/dL (ref 30.0–36.0)
MCHC: 33.1 g/dL (ref 30.0–36.0)
MCV: 89 fL (ref 78.0–100.0)
MCV: 88.5 fL (ref 78.0–100.0)
Platelets: 250 10*3/uL (ref 150–400)
Platelets: 237 10*3/uL (ref 150–400)
RBC: 3.08 MIL/uL — ABNORMAL LOW (ref 3.87–5.11)
RBC: 2.87 MIL/uL — ABNORMAL LOW (ref 3.87–5.11)
RDW: 14.4 % (ref 11.5–15.5)
RDW: 14.3 % (ref 11.5–15.5)
WBC: 15.8 10*3/uL — ABNORMAL HIGH (ref 4.0–10.5)
WBC: 17.1 10*3/uL — ABNORMAL HIGH (ref 4.0–10.5)

## 2014-08-11 LAB — RPR

## 2014-08-11 LAB — HIV ANTIBODY (ROUTINE TESTING W REFLEX): HIV 1&2 Ab, 4th Generation: NONREACTIVE

## 2014-08-11 LAB — ABO/RH: ABO/RH(D): O POS

## 2014-08-11 MED ORDER — ONDANSETRON HCL 4 MG PO TABS
4.0000 mg | ORAL_TABLET | ORAL | Status: DC | PRN
Start: 2014-08-11 — End: 2014-08-13

## 2014-08-11 MED ORDER — LIDOCAINE HCL (PF) 1 % IJ SOLN
INTRAMUSCULAR | Status: DC | PRN
  Administered 2014-08-11 (×2): 4 mL

## 2014-08-11 MED ORDER — EPHEDRINE 5 MG/ML INJ: 10.0000 mg | INTRAVENOUS | Status: DC | PRN

## 2014-08-11 MED ORDER — PAROXETINE HCL 30 MG PO TABS
30.0000 mg | ORAL_TABLET | Freq: Every day | ORAL | Status: DC
  Administered 2014-08-12: 30 mg via ORAL
  Filled 2014-08-11 (×2): qty 1

## 2014-08-11 MED ORDER — SIMETHICONE 80 MG PO CHEW: 80.0000 mg | CHEWABLE_TABLET | ORAL | Status: DC | PRN

## 2014-08-11 MED ORDER — FENTANYL 2.5 MCG/ML BUPIVACAINE 1/10 % EPIDURAL INFUSION (WH - ANES)
14.0000 mL/h | INTRAMUSCULAR | Status: DC | PRN
  Administered 2014-08-11 (×2): 14 mL/h via EPIDURAL
  Filled 2014-08-11: qty 125

## 2014-08-11 MED ORDER — PHENYLEPHRINE 40 MCG/ML (10ML) SYRINGE FOR IV PUSH (FOR BLOOD PRESSURE SUPPORT): 80.0000 ug | PREFILLED_SYRINGE | INTRAVENOUS | Status: DC | PRN

## 2014-08-11 MED ORDER — ONDANSETRON HCL 4 MG/2ML IJ SOLN: 4.0000 mg | INTRAMUSCULAR | Status: DC | PRN

## 2014-08-11 MED ORDER — LACTATED RINGERS IV SOLN: 500.0000 mL | Freq: Once | INTRAVENOUS | Status: DC

## 2014-08-11 MED ORDER — OXYCODONE-ACETAMINOPHEN 5-325 MG PO TABS
1.0000 | ORAL_TABLET | ORAL | Status: DC | PRN
  Administered 2014-08-12 (×4): 1 via ORAL
  Filled 2014-08-11 (×4): qty 1

## 2014-08-11 MED ORDER — SENNOSIDES-DOCUSATE SODIUM 8.6-50 MG PO TABS
2.0000 | ORAL_TABLET | ORAL | Status: DC
  Administered 2014-08-11 – 2014-08-13 (×2): 2 via ORAL
  Filled 2014-08-11 (×2): qty 2

## 2014-08-11 MED ORDER — DIBUCAINE 1 % RE OINT
1.0000 | TOPICAL_OINTMENT | RECTAL | Status: DC | PRN
Start: 2014-08-11 — End: 2014-08-13

## 2014-08-11 MED ORDER — TETANUS-DIPHTH-ACELL PERTUSSIS 5-2.5-18.5 LF-MCG/0.5 IM SUSP: 0.5000 mL | Freq: Once | INTRAMUSCULAR | Status: DC

## 2014-08-11 MED ORDER — OXYCODONE-ACETAMINOPHEN 5-325 MG PO TABS: 2.0000 | ORAL_TABLET | ORAL | Status: DC | PRN

## 2014-08-11 MED ORDER — LANOLIN HYDROUS EX OINT: TOPICAL_OINTMENT | CUTANEOUS | Status: DC | PRN

## 2014-08-11 MED ORDER — WITCH HAZEL-GLYCERIN EX PADS: 1.0000 "application " | MEDICATED_PAD | CUTANEOUS | Status: DC | PRN

## 2014-08-11 MED ORDER — ZOLPIDEM TARTRATE 5 MG PO TABS: 5.0000 mg | ORAL_TABLET | Freq: Every evening | ORAL | Status: DC | PRN

## 2014-08-11 MED ORDER — FENTANYL 2.5 MCG/ML BUPIVACAINE 1/10 % EPIDURAL INFUSION (WH - ANES)
INTRAMUSCULAR | Status: DC | PRN
  Administered 2014-08-11: 14 mL/h via EPIDURAL

## 2014-08-11 MED ORDER — FENTANYL 2.5 MCG/ML BUPIVACAINE 1/10 % EPIDURAL INFUSION (WH - ANES)
INTRAMUSCULAR | Status: AC
  Filled 2014-08-11: qty 125

## 2014-08-11 MED ORDER — IBUPROFEN 600 MG PO TABS
600.0000 mg | ORAL_TABLET | Freq: Four times a day (QID) | ORAL | Status: DC
  Administered 2014-08-11 – 2014-08-13 (×7): 600 mg via ORAL
  Filled 2014-08-11 (×7): qty 1

## 2014-08-11 MED ORDER — DIPHENHYDRAMINE HCL 50 MG/ML IJ SOLN: 12.5000 mg | INTRAMUSCULAR | Status: DC | PRN

## 2014-08-11 MED ORDER — BENZOCAINE-MENTHOL 20-0.5 % EX AERO: 1.0000 "application " | INHALATION_SPRAY | CUTANEOUS | Status: DC | PRN

## 2014-08-11 MED ORDER — PHENYLEPHRINE 40 MCG/ML (10ML) SYRINGE FOR IV PUSH (FOR BLOOD PRESSURE SUPPORT)
80.0000 ug | PREFILLED_SYRINGE | INTRAVENOUS | Status: DC | PRN
  Filled 2014-08-11: qty 10

## 2014-08-11 MED ORDER — PRENATAL MULTIVITAMIN CH
1.0000 | ORAL_TABLET | Freq: Every day | ORAL | Status: DC
  Administered 2014-08-12 – 2014-08-13 (×2): 1 via ORAL
  Filled 2014-08-11 (×2): qty 1

## 2014-08-11 MED ORDER — DIPHENHYDRAMINE HCL 25 MG PO CAPS: 25.0000 mg | ORAL_CAPSULE | Freq: Four times a day (QID) | ORAL | Status: DC | PRN

## 2014-08-11 NOTE — Progress Notes (Signed)
Brittany Beltran MRN: [id number]  Subjective: -Strip Reviewed.  Objective: BP 136/85 mmHg  Pulse 88  Temp(Src) 97.8 F (36.6 C) (Oral)  Resp 20  Ht 5\' 1"  (1.549 m)  Wt 168 lb (76.204 kg)  BMI 31.76 kg/m2     FHT: 115 bpm, Mod Var, -Decels, +Accels UC:   Q2-513min SVE:   Dilation: 3 Effacement (%): 50 Station: -3 Exam by:: Gerrit HeckJessica Benney Beltran CNM Membranes:Intact Pitocin:411mUn/min Foley Bulb remains in place  Assessment:  IUP at 39.1wks Cat I FT  Foley Bulb Induction Chronic HTN  Plan: -Strip reviewed, contractions remain the same as prior to pitocin initiation -Titrate pitocin, as ordered starting, at 0600 -Will continue to observe -Continue other mgmt as ordered  [facility]Brittany Beltran,CNM, MSN 08/11/2014, 5:42 AM

## 2014-08-11 NOTE — Progress Notes (Signed)
Brittany Beltran MRN: [id number]  Subjective: -Patient perceptive to contractions, state they have increased, slightly in intensity.  Denies PIH symptoms.    Objective: BP 157/86 mmHg  Pulse 86  Temp(Src) 98.6 F (37 C) (Axillary)  Resp 20  Ht 5\' 1"  (1.549 m)  Wt 168 lb (76.204 kg)  BMI 31.76 kg/m2     FHT: 115 bpm, Mod Var, -Decels, +Accels UC:   Q2-224min, palpates mild SVE:   Dilation: 3 Effacement (%): 50 Station: -3 Exam by:: Brittany HeckJessica Latajah Beltran CNM Membranes:Intact Pitocin: Initiated Foley Bulb-In place, Vaginal balloon deflated  Assessment:  IUP at 39.1wks Cat I FT Chronic HTN Foley Bulb Induction  Plan: -Foley bulb remains in place with some cervix surrounding 1/3 of balloon -Start low dose pitocin  -Will continue to observe -Continue other mgmt as ordered  Johntavius Shepard LYNN,CNM, MSN 08/11/2014, 3:21 AM

## 2014-08-11 NOTE — Plan of Care (Signed)
Problem: Phase I Progression Outcomes Goal: Pain controlled with appropriate interventions Outcome: Completed/Met Date Met:  08/11/14 Goal: Foley catheter patent Outcome: Not Applicable Date Met:  85/48/83 Goal: OOB as tolerated unless otherwise ordered Outcome: Completed/Met Date Met:  08/11/14 Goal: IS, TCDB as ordered Outcome: Not Applicable Date Met:  01/41/59 Goal: Initial discharge plan identified Outcome: Completed/Met Date Met:  08/11/14

## 2014-08-11 NOTE — Progress Notes (Signed)
Labor Progress  Subjective:  comfortable, no complaints, FOB in the room  Objective: BP 155/94 mmHg  Pulse 95  Temp(Src) 99.3 F (37.4 C) (Oral)  Resp 18  Ht 5\' 1"  (1.549 m)  Wt 168 lb (76.204 kg)  BMI 31.76 kg/m2  SpO2 98%   Total I/O In: -  Out: 750 [Urine:750] FHT: 125 moderate variability, + accel, occasional variable decel CTX:  regular, every 4-5 minutes Uterus gravid, soft non tender SVE:  Dilation: 5.5 Effacement (%): 80 Station: -2 Exam by:: Adreonna Yontz Standardcnm Pitocin at 9610mUn/min  Assessment:  IUP at 39. weeks NICHD: Category  2 Membranes:  AROM at 1721 Labor progress: progressing Pitocin Augmentation GBS: negative   Plan: Continue labor plan Continuous monitoring Rest Frequent position changes to facilitate fetal rotation and descent. Continue pitocin per protocol    Madsen Riddle, CNM, MSN 08/11/2014. 5:46 PM

## 2014-08-11 NOTE — Progress Notes (Signed)
Labor Progress  Subjective: Very uncomfortable, requesting IV pain medication x4.  Discussed getting an epidural  Objective: BP 144/88 mmHg  Pulse 89  Temp(Src) 98.3 F (36.8 C) (Oral)  Resp 18  Ht 5\' 1"  (1.549 m)  Wt 168 lb (76.204 kg)  BMI 31.76 kg/m2     FHT:115-120 CTX:  irregular, every 3-7 minutes Uterus gravid, soft non tender SVE:  Dilation:  (no Change) Effacement (%): 50 Station: -3 Exam by:: Brittany Beltran cnm Pitocin at 4 mUn/min  Assessment:  IUP at 39.1 weeks IOL for CHTN NICHD: Category 1 Membranes:  intact Labor progress: early Pitocin Augmentation GBS: negative  Plan: Continue labor plan Continuous monitoring Epidural Frequent position changes to facilitate fetal rotation and descent. Will reassess with cervical exam at 1200 or earlier if necessary Access foley bulb for expulsion q 30 minutes  Continue pitocin per protocol      Brittany Beltran, CNM, MSN 08/11/2014. 9:38 AM

## 2014-08-11 NOTE — Anesthesia Procedure Notes (Signed)
Epidural Patient location during procedure: OB Start time: 08/11/2014 9:18 AM  Staffing Anesthesiologist: Suheyla Mortellaro A. Performed by: anesthesiologist   Preanesthetic Checklist Completed: patient identified, site marked, surgical consent, pre-op evaluation, timeout performed, IV checked, risks and benefits discussed and monitors and equipment checked  Epidural Patient position: sitting Prep: site prepped and draped and DuraPrep Patient monitoring: continuous pulse ox and blood pressure Approach: midline Location: L3-L4 Injection technique: LOR air  Needle:  Needle type: Tuohy  Needle gauge: 17 G Needle length: 9 cm and 9 Needle insertion depth: 5 cm cm Catheter type: closed end flexible Catheter size: 19 Gauge Catheter at skin depth: 10 cm Test dose: negative and Other  Assessment Events: blood not aspirated, injection not painful, no injection resistance, negative IV test and no paresthesia  Additional Notes Patient identified. Risks and benefits discussed including failed block, incomplete  Pain control, post dural puncture headache, nerve damage, paralysis, blood pressure Changes, nausea, vomiting, reactions to medications-both toxic and allergic and post Partum back pain. All questions were answered. Patient expressed understanding and wished to proceed. Sterile technique was used throughout procedure. Epidural site was Dressed with sterile barrier dressing. No paresthesias, signs of intravascular injection Or signs of intrathecal spread were encountered.  Patient was more comfortable after the epidural was dosed. Please see RN's note for documentation of vital signs and FHR which are stable.

## 2014-08-11 NOTE — Progress Notes (Signed)
Labor Progress  Subjective: Comfortable with epidural  Objective: BP 135/82 mmHg  Pulse 81  Temp(Src) 98.5 F (36.9 C) (Oral)  Resp 18  Ht 5\' 1"  (1.549 m)  Wt 168 lb (76.204 kg)  BMI 31.76 kg/m2  SpO2 98%     FHT: 120, + accel, moderate variability, no decels CTX:  regular, every 3-4 minutes Uterus gravid, soft non tender SVE:  Dilation: 4 Effacement (%): 50 Station: -3 Exam by:: Nike Southers cnm Pitocin at 537mUn/min  Assessment:  IUP at 39.1 weeks NICHD: Category Membranes:  BBW Labor progress: progressing Pitocin Augmentation GBS: negative Foley bulb released with minimum tension   Plan: Continue labor plan Continuous/intermittent monitoring Rest Frequent position changes to facilitate fetal rotation and descent. Will reassess with cervical exam at 1500 or earlier if necessary Continue pitocin per protocol Considering AROM at next check     Norma Ignasiak, CNM, MSN 08/11/2014. 12:04 PM

## 2014-08-11 NOTE — Progress Notes (Signed)
Labor Progress  Subjective: Comfortable, no complaints  Objective: BP 150/96 mmHg  Pulse 90  Temp(Src) 98.4 F (36.9 C) (Oral)  Resp 18  Ht 5\' 1"  (1.549 m)  Wt 168 lb (76.204 kg)  BMI 31.76 kg/m2  SpO2 98%   Total I/O In: -  Out: 750 [Urine:750] FHT:  130, moderate variability, + accel, no decel CTX:  regular, every 2-3 minutes Uterus gravid, soft non tender SVE:  Dilation: 5 Effacement (%): 70 Station: -2 Exam by:: Dherr rn Pitocin at 519mUn/min  Assessment:  IUP at 39.1 weeks NICHD: Category Membranes:  intact Labor progress: progressing Pitocin Augmentation GBS: negative   Plan: Continue labor plan Continuous monitoring Frequent position changes to facilitate fetal rotation and descent. Will reassess with cervical exam at 1700 or earlier if necessary Continue pitocin per protocol   Tomeca Helm, CNM, MSN 08/11/2014. 4:21 PM

## 2014-08-11 NOTE — Anesthesia Preprocedure Evaluation (Signed)
Anesthesia Evaluation  Patient identified by MRN, date of birth, ID band Patient awake    Reviewed: Allergy & Precautions, H&P , Patient's Chart, lab work & pertinent test results  Airway Mallampati: III  TM Distance: >3 FB Neck ROM: Full    Dental no notable dental hx. (+) Teeth Intact   Pulmonary neg pulmonary ROS,  breath sounds clear to auscultation  Pulmonary exam normal       Cardiovascular hypertension, Pt. on medications and Pt. on home beta blockers Rhythm:Regular Rate:Normal  PIH   Neuro/Psych  Headaches, PSYCHIATRIC DISORDERS Anxiety Depression    GI/Hepatic Neg liver ROS, GERD-  Medicated and Controlled,  Endo/Other  Obesity  Renal/GU Renal disease  negative genitourinary   Musculoskeletal negative musculoskeletal ROS (+)   Abdominal (+) + obese,   Peds  Hematology  (+) anemia ,   Anesthesia Other Findings   Reproductive/Obstetrics (+) Pregnancy                             Anesthesia Physical Anesthesia Plan  ASA: III  Anesthesia Plan: Epidural   Post-op Pain Management:    Induction:   Airway Management Planned: Natural Airway  Additional Equipment:   Intra-op Plan:   Post-operative Plan:   Informed Consent: I have reviewed the patients History and Physical, chart, labs and discussed the procedure including the risks, benefits and alternatives for the proposed anesthesia with the patient or authorized representative who has indicated his/her understanding and acceptance.     Plan Discussed with: Anesthesiologist  Anesthesia Plan Comments:         Anesthesia Quick Evaluation

## 2014-08-11 NOTE — Lactation Note (Addendum)
This note was copied from the chart of Brittany Beltran. Lactation Consultation Note Initial visit at 3 hours of age.  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  Buspar, Remeron and Ambien are all L3 per Herma Ardhomas Hales book with limited research, but probably compatible.  Mom is also taking Zoloft which is a L1 and ok as well.   Encouraged mom to discuss further with peds DR. On approval for baby to continue to have breast milk.  Mom is supplementing with Formula until she is able to collect breast milk with pumping.  Mom plans to exclusively pump, she has a history of mastitis with abcsess requiring surgical removal with scar noted to right breast 4-6 position area.    Discussed pumping every 3 hours on preemie setting for 8 sessions in 24 hours, mom has pump already set up at bedside.  [facility] given and discussed.  Encouraged to feed with early cues on demand.  Early newborn behavior discussed. Mom to call for assist as needed.    Patient Name: Brittany Beltran JXBJY'NToday's Date: 08/11/2014 Reason for consult: Initial assessment   Maternal Data Has patient been taught Hand Expression?: Yes Does the patient have breastfeeding experience prior to this delivery?: Yes  Feeding Feeding Type: Other (comment) (mom wants to pump and bottle feed)  LATCH Score/Interventions                      Lactation Tools Discussed/Used     Consult Status Consult Status: Follow-up Date: 08/12/14 Follow-up type: In-patient    Brittany Beltran, Brittany MerlesJana Lynn 08/11/2014, 11:39 PM

## 2014-08-12 LAB — CBC
HCT: 22 % — ABNORMAL LOW (ref 36.0–46.0)
HEMOGLOBIN: 7 g/dL — AB (ref 12.0–15.0)
MCH: 29.2 pg (ref 26.0–34.0)
MCHC: 31.8 g/dL (ref 30.0–36.0)
MCV: 91.7 fL (ref 78.0–100.0)
PLATELETS: 230 10*3/uL (ref 150–400)
RBC: 2.4 MIL/uL — AB (ref 3.87–5.11)
RDW: 14.4 % (ref 11.5–15.5)
WBC: 16 10*3/uL — AB (ref 4.0–10.5)

## 2014-08-12 MED ORDER — FERROUS SULFATE 325 (65 FE) MG PO TABS
325.0000 mg | ORAL_TABLET | Freq: Two times a day (BID) | ORAL | Status: DC
  Administered 2014-08-12 – 2014-08-13 (×2): 325 mg via ORAL
  Filled 2014-08-12 (×2): qty 1
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
## 2014-08-12 NOTE — Plan of Care (Signed)
Problem: Phase I Progression Outcomes Goal: Voiding adequately Outcome: Completed/Met Date Met:  08/12/14 Goal: VS, stable, temp < 100.4 degrees F Outcome: Completed/Met Date Met:  08/12/14 Goal: Other Phase I Outcomes/Goals Outcome: Completed/Met Date Met:  08/12/14

## 2014-08-12 NOTE — Lactation Note (Signed)
This note was copied from the chart of Brittany Beltran. Lactation Consultation Note  Patient Name: Brittany Beltran ZOXWR'UToday's Date: 08/12/2014 Reason for consult: Follow-up assessment;Other (Comment) (pumping only).  Mom has decided not to pump or breastfeed at this time.  LC encouraged her to take home her pump parts (in case she decides to pump after discharge).  Mom states that her experience with abscess makes her feel uncomfortable with breastfeeding with this baby.  LC reviewed engorgement care for mom, with use of cabbage leaves if she decides not to stimulate milk production with pump.  LC also informed mom that she can pump briefly for comfort (no more than 5 minutes at a time) and gradually decrease pumping as engorgement subsides.   Maternal Data    Feeding Feeding Type: Bottle Fed - Formula Nipple Type: Slow - flow  LATCH Score/Interventions                      Lactation Tools Discussed/Used   DEBP Engorgement care Reduction of milk production  Consult Status Consult Status: PRN    Lynda RainwaterBryant, Maleaha Hughett Parmly 08/12/2014, 8:32 PM

## 2014-08-12 NOTE — Progress Notes (Addendum)
Subjective: Postpartum Day 1: Vaginal delivery, no lacerations Patient up ad lib, had very mild dizziness on initial ambulation. Feeding:  Breast Contraceptive plan:  Undecided  On Labetalol 200 mg po BID.  Objective: Vital signs in last 24 hours: Temp:  [98.1 F (36.7 C)-99.3 F (37.4 C)] 98.1 F (36.7 C) (11/27 0546) Pulse Rate:  [81-111] 86 (11/27 0546) Resp:  [18-20] 18 (11/27 0546) BP: (125-175)/(69-96) 135/74 mmHg (11/27 0546) SpO2:  [96 %-100 %] 100 % (11/27 0546)   BP range since delivery 130s-150s/74-80s  Physical Exam:  General: alert Lochia: appropriate Uterine Fundus: firm Perineum: Perineum clear DVT Evaluation: No evidence of DVT seen on physical exam. Negative Homan's sign.    Recent Labs  08/11/14 2035 08/12/14 0539  HGB 8.4* 7.0*  HCT 25.4* 22.0*  Pre-delivery Hgb on admission 9.1--hx chronic anemia  Assessment/Plan: Status post vaginal delivery day 1 Anemia without hemodynamic instability. Stable Continue current care. Plan for discharge tomorrow  Check orthostatics. Rx FeSO4 BID. Recheck CBC tomorrow. Patient declines transfusion at present.    Janiene Aarons, VICKICNM 08/12/2014, 8:29 AM

## 2014-08-12 NOTE — Discharge Summary (Signed)
Vaginal Delivery Discharge Summary  Brittany Beltran  DOB:    Nov 12, 1980 MRN:    [id number] CSN:    [id number]  Date of admission:                  08/10/14  Date of discharge:  Brittany Beltran                 08/13/14  Procedures this admission:   Induction of labor, epidural, SVB  Date of Delivery: 08/11/14  Newborn Data:  Live born female  Birth Weight: 7 lb 13.2 oz (3550 g) APGAR: 6, 7  Home with mother. Name: Brittany Beltran Circumcision Plan: Declined  History of Present Illness:  [name] is a 33 y.o. female, G2P2002, who presents at 3055w1d weeks gestation. [facility]. She was admitted induction of labor due to chronic hypertension . Her pregnancy has been complicated by:  Patient Active Problem List   Diagnosis Date Noted  . SVD (spontaneous vaginal delivery) 08/11/2014  . HTN in pregnancy, chronic 08/10/2014  . Vomiting affecting pregnancy 03/10/2014  . H/O mastitis--severe, staph aureus-after last delivery 03/10/2014  . H/O postpartum depression, currently pregnant--severe, required hospitalization 03/10/2014  . History of gestational hypertension 03/10/2014  . History of kidney stones 03/10/2014  . Anxiety and depression--on Sertraline, buspirone and remeron 03/10/2014  . Rubella non-immune status, antepartum 03/10/2014  . Latex allergy 03/10/2014  . Vancomycin adverse reaction 03/10/2014     Hospital Course:  Admitted 08/10/14. Negative GBS.  Foley bulb placed, low dose pitocin utilized.  Progressed after AROM. Utilized epidural for pain management.  Delivery was performed by Gerrit HeckJessica Emly, CNM, without complication. Patient and baby tolerated the procedure without difficulty, with no lacerations noted. Infant status was stable and remained in room with mother.  Mother and infant then had an uncomplicated postpartum course, with pumping for breast feeding going well.  Mom's physical exam was WNL, and she was discharged home in stable condition. Contraception plan was undecided at the time of d/c.Marland Kitchen.  She received adequate benefit from po pain medications.  Hgb was 7.0 on day 1, 6.8 on day 2.  (Hgb 9.1 on admission).  She declined transfusion and was placed on Fe.  SW consult occurred on day 1 due to significant hx of depression and pp psychosis, on multiple meds.  [facility] to monitor her meds..  She was noting increased fluid retention in feet on day 2, and was started on HCTZ 25 mg po daily x 7 days.  Patient will be seen in 2 weeks for f/u on status--[place]    Feeding:  Bottlefeeding--elected to not breastfeed due to difficulties last time with severe mastitis.  Contraception:  Will decide  Discharge hemoglobin:  HEMOGLOBIN  Date Value Ref Range Status  08/13/2014 6.8* 12.0 - 15.0 g/dL Final    Comment:    REPEATED TO VERIFY CRITICAL RESULT CALLED TO, READ BACK BY AND VERIFIED WITH: JAN WELLS RN.@[id number]  ON 11.28.15 BY TCALDWELL    HCT  Date Value Ref Range Status  08/13/2014 20.2* 36.0 - 46.0 % Final    Discharge Physical Exam:   General: alert Lochia: appropriate Uterine Fundus: firm Incision: Perineum intact DVT Evaluation: No evidence of DVT seen on physical exam. Negative Homan's sign.  Intrapartum Procedures: spontaneous vaginal delivery Postpartum Procedures: none Complications-Operative and Postpartum: Anemia without hemodynamic instability.  Discharge Diagnoses: Term  Pregnancy-delivered, anemia, pedal edema, chronic hypertension  Discharge Information:  Activity:           pelvic rest Diet:                routine Medications: Ibuprofen, Iron, Percocet and HCTA x 1 week--continue  previous meds . busPIRone  15 mg Oral QID  . ferrous sulfate  325 mg Oral BID WC  . hydrochlorothiazide  25 mg Oral Daily  . ibuprofen  600 mg Oral 4 times per day  . labetalol  200 mg Oral BID  .  measles, mumps and rubella vaccine  0.5 mL Subcutaneous Once  . mirtazapine  15 mg Oral QHS  . PARoxetine  30 mg Oral QHS  . prenatal multivitamin  1 tablet Oral Q1200  . senna-docusate  2 tablet Oral Q24H  . sertraline  75 mg Oral QHS  . Tdap  0.5 mL Intramuscular Once  . zolpidem  5 mg Oral QHS   Condition:      stable Instructions:     Discharge to: home  Plan f/u in 2 weeks due to hx of PP depression and psychosis after last delivery.  Follow-up Information    [facility] In 2 weeks.   Specialty:  Obstetrics and Gynecology   Why:  Office will call to schedule 2 week f/u to check on blood pressure, swelling, and other status.  Call with any questions or concerns.   Contact information:   [address] [place] [phone]       Nigel BridgemanLATHAM, Caliegh Middlekauff CNM 08/13/2014 9:04 AM

## 2014-08-12 NOTE — Anesthesia Postprocedure Evaluation (Signed)
Anesthesia Post Note  Patient: Brittany Beltran  Procedure(s) Performed: * No procedures listed *  Anesthesia type: Epidural  Patient location: Mother/Baby  Post pain: Pain level controlled  Post assessment: Post-op Vital signs reviewed  Last Vitals:  Filed Vitals:   08/12/14 0546  BP: 135/74  Pulse: 86  Temp: 36.7 C  Resp: 18    Post vital signs: Reviewed  Level of consciousness: awake  Complications: No apparent anesthesia complications

## 2014-08-12 NOTE — Plan of Care (Signed)
Problem: Consults Goal: Skin Care Protocol Initiated - if Braden Score 18 or less If consults are not indicated, leave blank or document N/A  Outcome: Completed/Met Date Met:  08/12/14 Goal: Nutrition Consult-if indicated Outcome: Not Applicable Date Met:  70/44/92 Goal: Diabetes Guidelines if Diabetic/Glucose > 140 If diabetic or lab glucose is > 140 mg/dl - Initiate Diabetes/Hyperglycemia Guidelines & Document Interventions  Outcome: Not Applicable Date Met:  52/41/59  Problem: Phase II Progression Outcomes Goal: Pain controlled on oral analgesia Outcome: Completed/Met Date Met:  08/12/14 Goal: Progress activity as tolerated unless otherwise ordered Outcome: Completed/Met Date Met:  08/12/14 Goal: Afebrile, VS remain stable Outcome: Completed/Met Date Met:  08/12/14 Goal: Incision intact & without signs/symptoms of infection Outcome: Not Applicable Date Met:  01/72/41 Goal: Rh isoimmunization per orders Outcome: Not Applicable Date Met:  95/42/48 Goal: Tolerating diet Outcome: Completed/Met Date Met:  08/12/14 Goal: Other Phase II Outcomes/Goals Outcome: Completed/Met Date Met:  08/12/14  Problem: Discharge Progression Outcomes Goal: Remove staples per MD order Outcome: Not Applicable Date Met:  14/43/92

## 2014-08-13 LAB — CBC
HEMATOCRIT: 20.2 % — AB (ref 36.0–46.0)
HEMOGLOBIN: 6.8 g/dL — AB (ref 12.0–15.0)
MCH: 29.6 pg (ref 26.0–34.0)
MCHC: 33.2 g/dL (ref 30.0–36.0)
MCV: 89.4 fL (ref 78.0–100.0)
Platelets: 222 10*3/uL (ref 150–400)
RBC: 2.26 MIL/uL — AB (ref 3.87–5.11)
RDW: 14.6 % (ref 11.5–15.5)
WBC: 13.8 10*3/uL — AB (ref 4.0–10.5)

## 2014-08-13 MED ORDER — HYDROCHLOROTHIAZIDE 25 MG PO TABS: 25.0000 mg | ORAL_TABLET | Freq: Every day | ORAL | Status: AC

## 2014-08-13 MED ORDER — MEASLES, MUMPS & RUBELLA VAC ~~LOC~~ INJ
0.5000 mL | INJECTION | Freq: Once | SUBCUTANEOUS | Status: AC
  Administered 2014-08-13: 0.5 mL via SUBCUTANEOUS
  Filled 2014-08-13: qty 0.5

## 2014-08-13 MED ORDER — HYDROCHLOROTHIAZIDE 25 MG PO TABS
25.0000 mg | ORAL_TABLET | Freq: Every day | ORAL | Status: DC
  Administered 2014-08-13: 25 mg via ORAL
  Filled 2014-08-13: qty 1

## 2014-08-13 MED ORDER — FERROUS SULFATE 325 (65 FE) MG PO TABS: 325.0000 mg | ORAL_TABLET | Freq: Two times a day (BID) | ORAL | Status: AC

## 2014-08-13 MED ORDER — OXYCODONE-ACETAMINOPHEN 5-325 MG PO TABS: 1.0000 | ORAL_TABLET | ORAL | Status: AC | PRN

## 2014-08-13 MED ORDER — IBUPROFEN 600 MG PO TABS: 600.0000 mg | ORAL_TABLET | Freq: Four times a day (QID) | ORAL | Status: AC | PRN

## 2014-08-13 NOTE — Progress Notes (Signed)
CRITICAL VALUE ALERT  Critical value received:  6.8 hemoglobin   Date of notification:  08/13/2014  Time of notification: 0650  Critical value read back:Yes.    Nurse who received alert:  Theda SersJan Adama Ferber   MD notified (1st page): Manfred ArchV.. Latham  Time of first page:  [phone]  MD notified (2nd page):   Time of second page:  Responding MD:  Manfred ArchV. Latham  Time MD responded:  [phone]

## 2014-08-13 NOTE — Discharge Instructions (Signed)
Postpartum Depression and Baby Blues °The postpartum period begins right after the birth of a baby. During this time, there is often a great amount of joy and excitement. It is also a time of many changes in the life of the parents. Regardless of how many times a mother gives birth, each child brings new challenges and dynamics to the family. It is not unusual to have feelings of excitement along with confusing shifts in moods, emotions, and thoughts. All mothers are at risk of developing postpartum depression or the "baby blues." These mood changes can occur right after giving birth, or they may occur many months after giving birth. The baby blues or postpartum depression can be mild or severe. Additionally, postpartum depression can go away rather quickly, or it can be a long-term condition.  °CAUSES °Raised hormone levels and the rapid drop in those levels are thought to be a main cause of postpartum depression and the baby blues. A number of hormones change during and after pregnancy. Estrogen and progesterone usually decrease right after the delivery of your baby. The levels of thyroid hormone and various cortisol steroids also rapidly drop. Other factors that play a role in these mood changes include major life events and genetics.  °RISK FACTORS °If you have any of the following risks for the baby blues or postpartum depression, know what symptoms to watch out for during the postpartum period. Risk factors that may increase the likelihood of getting the baby blues or postpartum depression include: °· Having a personal or family history of depression.   °· Having depression while being pregnant.   °· Having premenstrual mood issues or mood issues related to oral contraceptives. °· Having a lot of life stress.   °· Having marital conflict.   °· Lacking a social support network.   °· Having a baby with special needs.   °· Having health problems, such as diabetes.   °SIGNS AND SYMPTOMS °Symptoms of baby blues  include: °· Brief changes in mood, such as going from extreme happiness to sadness. °· Decreased concentration.   °· Difficulty sleeping.   °· Crying spells, tearfulness.   °· Irritability.   °· Anxiety.   °Symptoms of postpartum depression typically begin within the first month after giving birth. These symptoms include: °· Difficulty sleeping or excessive sleepiness.   °· Marked weight loss.   °· Agitation.   °· Feelings of worthlessness.   °· Lack of interest in activity or food.   °Postpartum psychosis is a very serious condition and can be dangerous. Fortunately, it is rare. Displaying any of the following symptoms is cause for immediate medical attention. Symptoms of postpartum psychosis include:  °· Hallucinations and delusions.   °· Bizarre or disorganized behavior.   °· Confusion or disorientation.   °DIAGNOSIS  °A diagnosis is made by an evaluation of your symptoms. There are no medical or lab tests that lead to a diagnosis, but there are various questionnaires that a health care provider may use to identify those with the baby blues, postpartum depression, or psychosis. Often, a screening tool called the Edinburgh Postnatal Depression Scale is used to diagnose depression in the postpartum period.  °TREATMENT °The baby blues usually goes away on its own in 1-2 weeks. Social support is often all that is needed. You will be encouraged to get adequate sleep and rest. Occasionally, you may be given medicines to help you sleep.  °Postpartum depression requires treatment because it can last several months or longer if it is not treated. Treatment may include individual or group therapy, medicine, or both to address any social, physiological, and psychological   factors that may play a role in the depression. Regular exercise, a healthy diet, rest, and social support may also be strongly recommended.  °Postpartum psychosis is more serious and needs treatment right away. Hospitalization is often needed. °HOME CARE  INSTRUCTIONS °· Get as much rest as you can. Nap when the baby sleeps.   °· Exercise regularly. Some women find yoga and walking to be beneficial.   °· Eat a balanced and nourishing diet.   °· Do little things that you enjoy. Have a cup of tea, take a bubble bath, read your favorite magazine, or listen to your favorite music. °· Avoid alcohol.   °· Ask for help with household chores, cooking, grocery shopping, or running errands as needed. Do not try to do everything.   °· Talk to people close to you about how you are feeling. Get support from your partner, family members, friends, or other new moms. °· Try to stay positive in how you think. Think about the things you are grateful for.   °· Do not spend a lot of time alone.   °· Only take over-the-counter or prescription medicine as directed by your health care provider. °· Keep all your postpartum appointments.   °· Let your health care provider know if you have any concerns.   °SEEK MEDICAL CARE IF: °You are having a reaction to or problems with your medicine. °SEEK IMMEDIATE MEDICAL CARE IF: °· You have suicidal feelings.   °· You think you may harm the baby or someone else. °MAKE SURE YOU: °· Understand these instructions. °· Will watch your condition. °· Will get help right away if you are not doing well or get worse. °Document Released: 06/06/2004 Document Revised: 09/07/2013 Document Reviewed: 06/14/2013 °[employer]® Patient Information ©2015 [employer]. This information is not intended to replace advice given to you by your health care provider. Make sure you discuss any questions you have with your health care provider. °Postpartum Care After Vaginal Delivery °After you deliver your newborn (postpartum period), the usual stay in the hospital is 24-72 hours. If there were problems with your labor or delivery, or if you have other medical problems, you might be in the hospital longer.  °While you are in the hospital, you will receive help and instructions on  how to care for yourself and your newborn during the postpartum period.  °While you are in the hospital: °· Be sure to tell your nurses if you have pain or discomfort, as well as where you feel the pain and what makes the pain worse. °· If you had an incision made near your vagina (episiotomy) or if you had some tearing during delivery, the nurses may put ice packs on your episiotomy or tear. The ice packs may help to reduce the pain and swelling. °· If you are breastfeeding, you may feel uncomfortable contractions of your uterus for a couple of weeks. This is normal. The contractions help your uterus get back to normal size. °· It is normal to have some bleeding after delivery. °¨ For the first 1-3 days after delivery, the flow is red and the amount may be similar to a period. °¨ It is common for the flow to start and stop. °¨ In the first few days, you may pass some small clots. Let your nurses know if you begin to pass large clots or your flow increases. °¨ Do not  flush blood clots down the toilet before having the nurse look at them. °¨ During the next 3-10 days after delivery, your flow should become more watery   and pink or brown-tinged in color. °¨ Ten to fourteen days after delivery, your flow should be a small amount of yellowish-white discharge. °¨ The amount of your flow will decrease over the first few weeks after delivery. Your flow may stop in 6-8 weeks. Most women have had their flow stop by 12 weeks after delivery. °· You should change your sanitary pads frequently. °· Wash your hands thoroughly with soap and water for at least 20 seconds after changing pads, using the toilet, or before holding or feeding your newborn. °· You should feel like you need to empty your bladder within the first 6-8 hours after delivery. °· In case you become weak, lightheaded, or faint, call your nurse before you get out of bed for the first time and before you take a shower for the first time. °· Within the first few  days after delivery, your breasts may begin to feel tender and full. This is called engorgement. Breast tenderness usually goes away within 48-72 hours after engorgement occurs. You may also notice milk leaking from your breasts. If you are not breastfeeding, do not stimulate your breasts. Breast stimulation can make your breasts produce more milk. °· Spending as much time as possible with your newborn is very important. During this time, you and your newborn can feel close and get to know each other. Having your newborn stay in your room (rooming in) will help to strengthen the bond with your newborn.  It will give you time to get to know your newborn and become comfortable caring for your newborn. °· Your hormones change after delivery. Sometimes the hormone changes can temporarily cause you to feel sad or tearful. These feelings should not last more than a few days. If these feelings last longer than that, you should talk to your caregiver. °· If desired, talk to your caregiver about methods of family planning or contraception. °· Talk to your caregiver about immunizations. Your caregiver may want you to have the following immunizations before leaving the hospital: °¨ Tetanus, diphtheria, and pertussis (Tdap) or tetanus and diphtheria (Td) immunization. It is very important that you and your family (including grandparents) or others caring for your newborn are up-to-date with the Tdap or Td immunizations. The Tdap or Td immunization can help protect your newborn from getting ill. °¨ Rubella immunization. °¨ Varicella (chickenpox) immunization. °¨ Influenza immunization. You should receive this annual immunization if you did not receive the immunization during your pregnancy. °Document Released: 06/30/2007 Document Revised: 05/27/2012 Document Reviewed: 04/29/2012 °[employer]® Patient Information ©2015 [employer]. This information is not intended to replace advice given to you by your health care provider. Make  sure you discuss any questions you have with your health care provider. ° °

## 2014-08-15 NOTE — Progress Notes (Signed)
Post discharge chart review completed.  

## 2018-11-02 ENCOUNTER — Other Ambulatory Visit: Payer: Self-pay | Admitting: Urology

## 2018-11-11 ENCOUNTER — Encounter (HOSPITAL_COMMUNITY): Payer: Self-pay | Admitting: *Deleted

## 2018-11-24 ENCOUNTER — Encounter (HOSPITAL_COMMUNITY): Admission: RE | Admit: 2018-11-24 | Payer: Medicaid Other | Source: Ambulatory Visit

## 2018-11-27 ENCOUNTER — Encounter (HOSPITAL_COMMUNITY): Admission: RE | Payer: Self-pay | Source: Home / Self Care

## 2018-11-27 ENCOUNTER — Inpatient Hospital Stay (HOSPITAL_COMMUNITY): Admission: RE | Admit: 2018-11-27 | Payer: Medicaid Other | Source: Home / Self Care | Admitting: Urology

## 2018-11-27 SURGERY — NEPHROLITHOTOMY PERCUTANEOUS
Anesthesia: General | Laterality: Right
